# Patient Record
Sex: Female | Born: 1950 | Race: White | Hispanic: No | Marital: Married
Health system: Midwestern US, Academic
[De-identification: ages and names within clinical notes are randomized; demographics above are authoritative.]

---

## 2020-02-13 IMAGING — CT ABDOMEN_PELVIS W(Adult)
2 of 3 series · 13 of 46 positions shown, 15 images · IV contrast (Omnipaque)
Comparison: December 27, 2019

EXAM:  CT ABDOMEN AND PELVIS WITH INTRAVENOUS CONTRAST  (09100)
INDICATION: ever recent laparotomy Pt c/o fever after recent laparotomy today. H/o
cholecystectomy, hysterectomy, appy, and bowel resection. Creat:0.84 GFR:64.4 used. Prior CT
12/27/19. CT/NM 1/0. CF/AK
TECHNIQUE: Axial computed tomography images of the abdomen and pelvis with intravenous contrast.
Sagittal and coronal reformatted images were created and reviewed.

[Series 2: abdomen_pelvis ax 3.00 br40 s3 · axial · 0.57mm/px · z∈[+1317,+1686]mm · 10 of 143 slices shown, 12 images]
[im 10/143  soft-tissue]
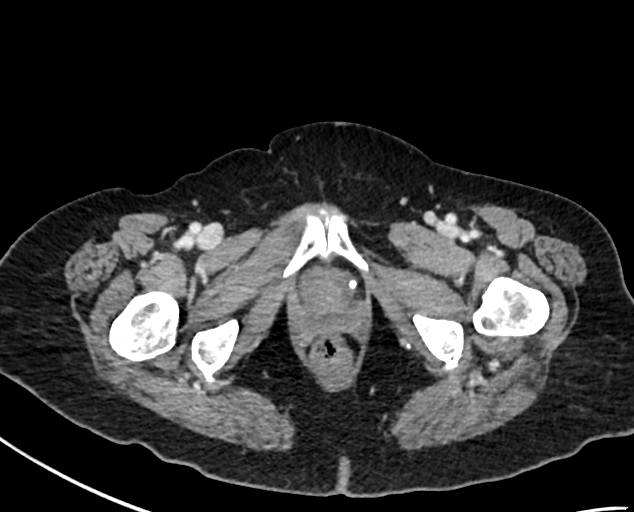
[im 10/143  bone]
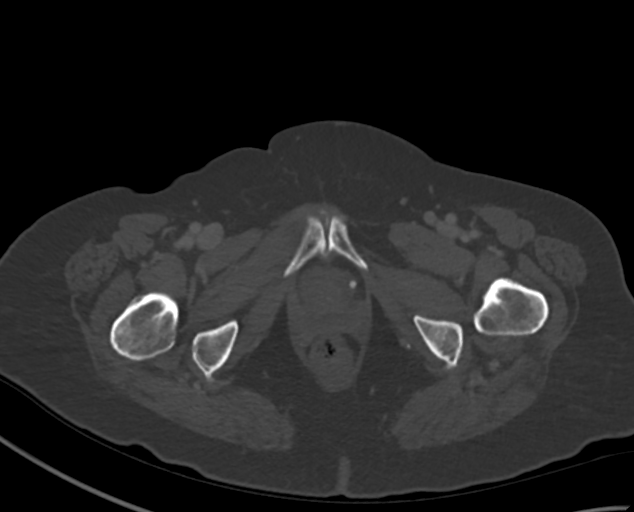
[im 23/143  soft-tissue]
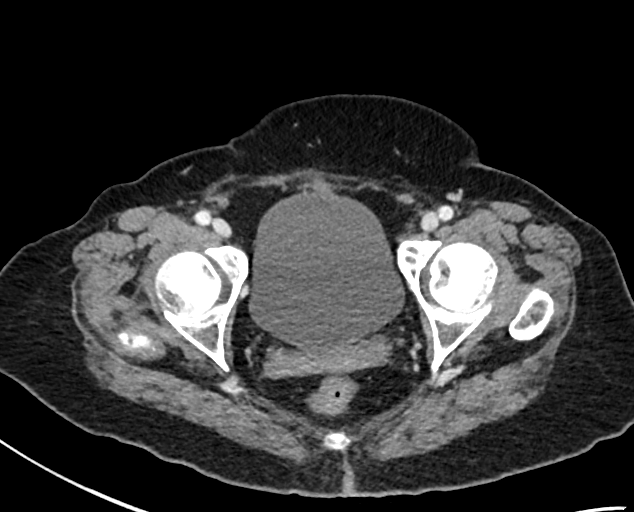
[im 37/143  soft-tissue]
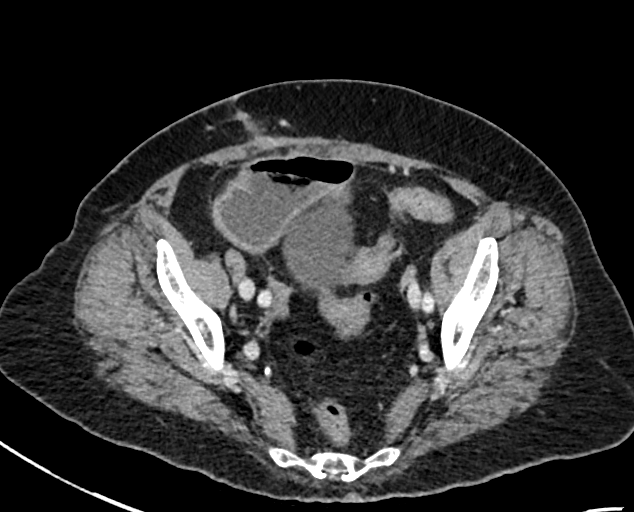
[im 51/143  soft-tissue]
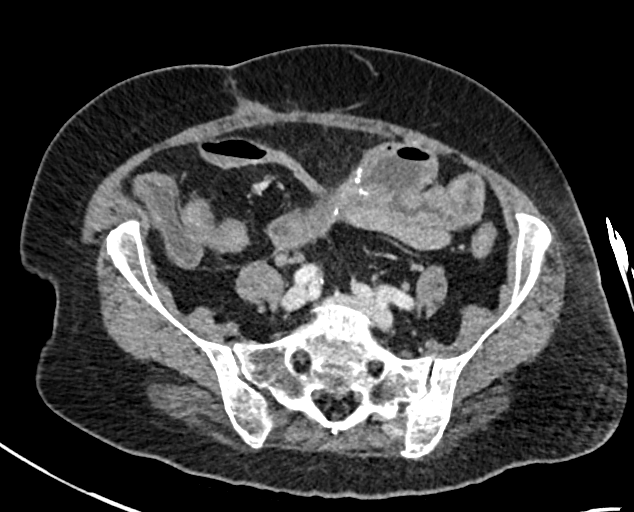
[im 65/143  soft-tissue]
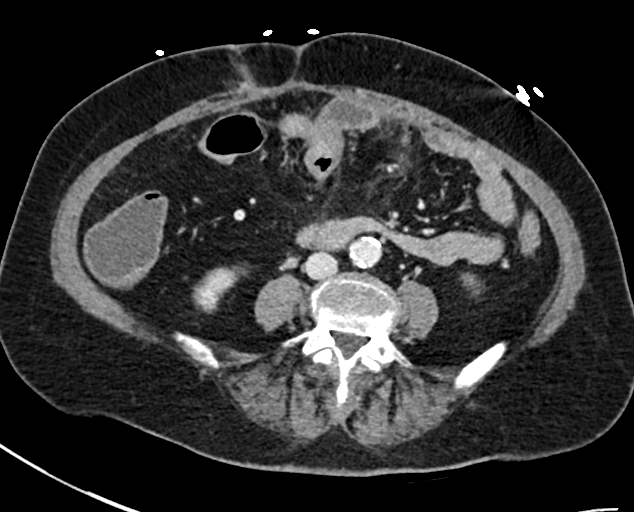
[im 78/143  soft-tissue]
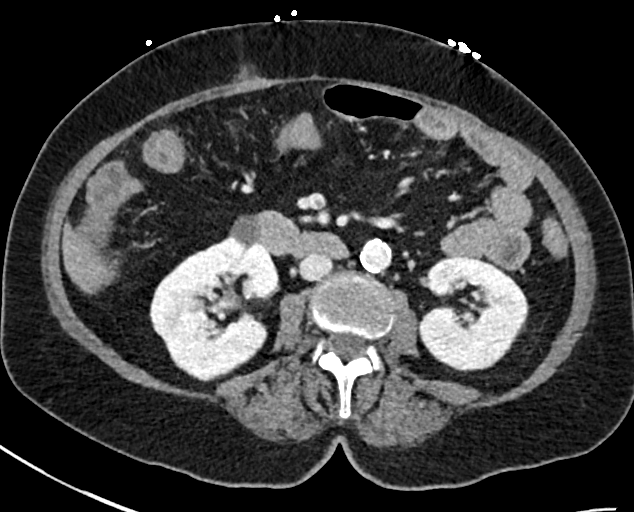
[im 92/143  soft-tissue]
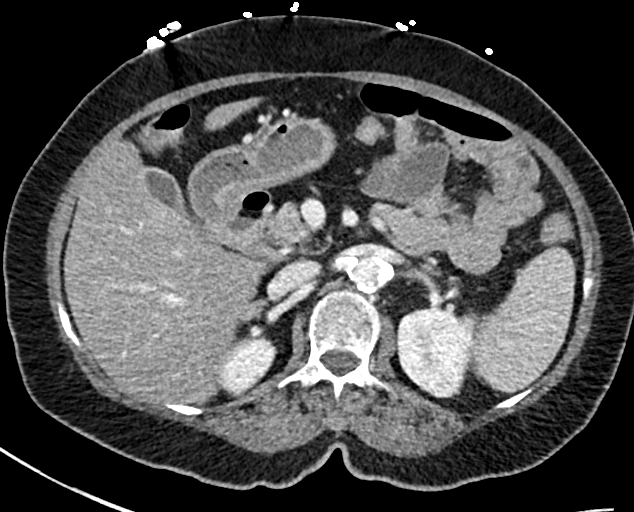
[im 106/143  soft-tissue]
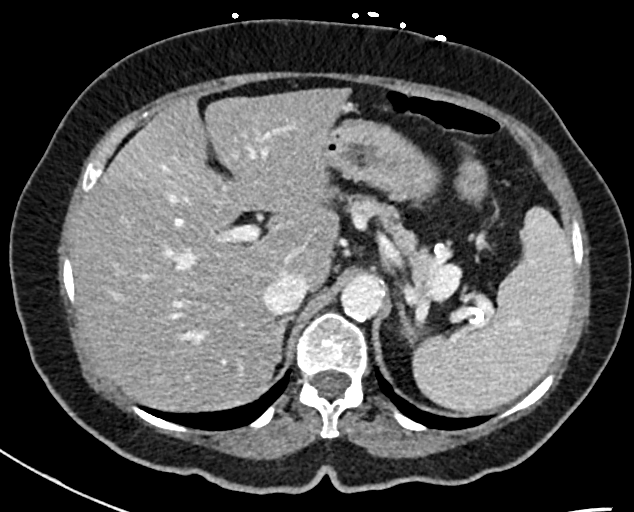
[im 120/143  soft-tissue]
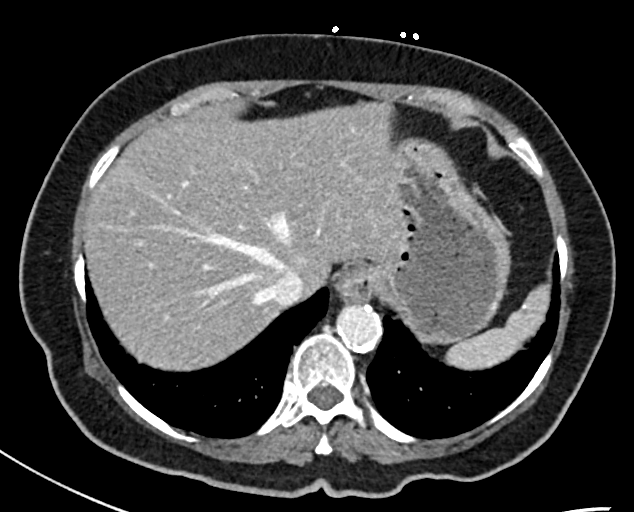
[im 120/143  bone]
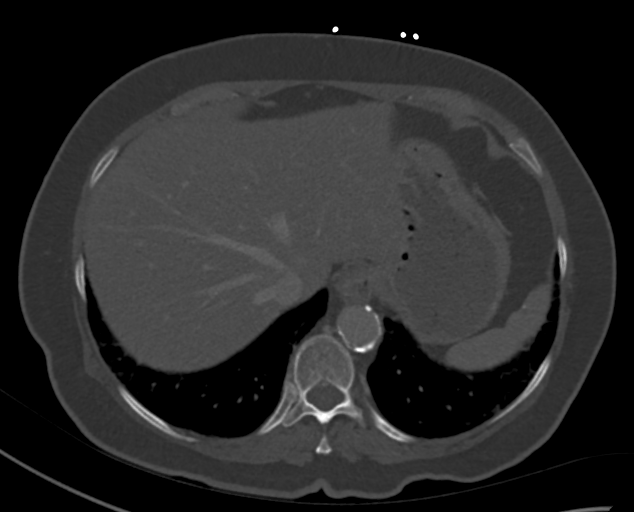
[im 133/143  soft-tissue]
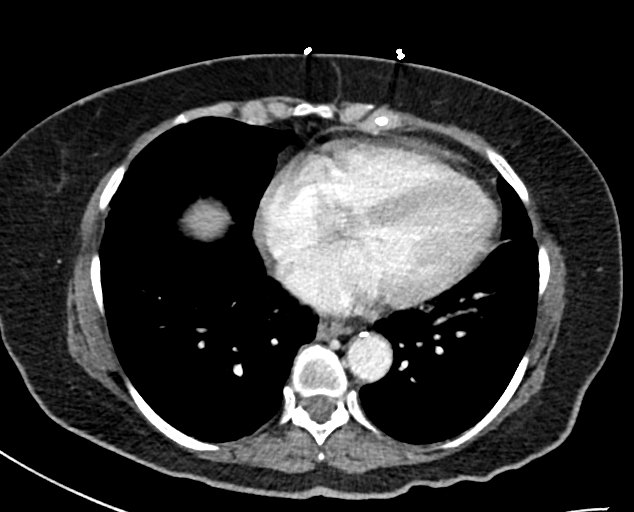

[Series 4: abdomen_pelvis cor 3.00 br40 s3 · coronal · 0.70mm/px · 3 of 94 slices shown]
[im 32/94  soft-tissue]
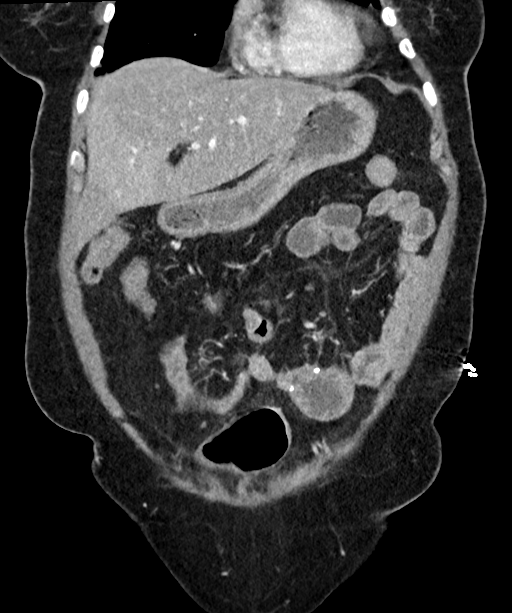
[im 42/94  soft-tissue]
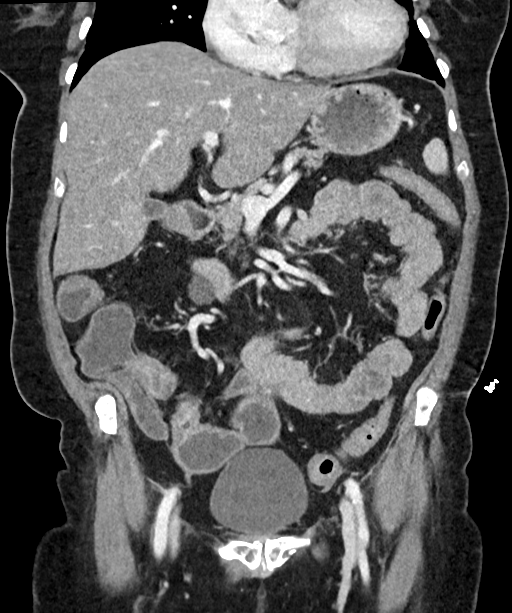
[im 52/94  soft-tissue]
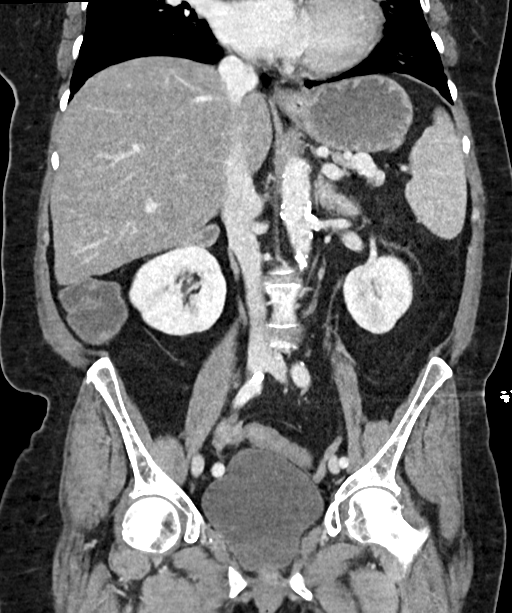

[13 of 46 positions shown; findings below may reference images not displayed]

All CT scans at this facility use dose modulation, interval reconstruction, and/or weight-based
dosing when appropriate to reduce radiation dose to as low as reasonably achievable.

Number of previous computed tomography exams in the last 12 months is 1.

Number of previous nuclear medicine myocardial perfusion studies in the last 12 months is 0.

CONTRAST:  [id number]
FINDINGS: Superior mesenteric venous thrombosis involving a LEFT branch, 67-72/2.  Mild dilation of the
portal vein.

Esophageal hiatal hernia.  Accentuation of the gastric wall with perigastric hyperemia.
Postoperative changes of the small bowel with ischemic enteritis morphology of small bowel segment
in the LEFT lower quadrant.  Adjacent mesenteric edema.  Segmental areas of bowel wall thickening
mainly involving the small bowel with soft tissue attenuation nonspecific.  Overall less dilation of
small bowel when compared to previous.  No definite mechanical small bowel obstruction.  Nodular
enhancing focus noted within the terminal ileal lumen on image 45/4, for example.  Neoplastic small
bowel lesion not excluded.  Diverticulosis without diverticulitis. Accentuation of the colonic wall
in part related to nondistention. No pathologic free fluid.  No free air. Hysterectomy.

Nonspecific subpleural reticular nodular opacities seen with bronchiolitis, for example.
Atelectatic changes.

Trace pericardial effusion.

No acute pathology of the included liver, biliary system, spleen, pancreas, adrenal glands, or
kidneys and unchanged.  Tortuous ectasia versus aneurysmal dilation of the peripheral splenic artery
at the splenic hilum, unchanged.  Postop changes in the RIGHT ventral subcutaneous superficial soft
tissues with focus of gas in the wall.  No abscess.

Osseous degenerative changes unchanged.  Osseous degenerative changes and osteopenia.
IMPRESSION: - Superior mesenteric venous thrombosis involving a LEFT branch and early ischemic enteritis
morphology as described.

- There are other chronic/incidental findings as described.

Footer: FINDING CONSIDERED CRITICAL TO PATIENT CARE.  Case discussed telephonically with Tiger

Tech Notes:

Pt c/o fever after recent laparotomy today. H/o cholecystectomy, hysterectomy, appy, and bowel
resection. Creat:0.84 GFR:64.4 [id number] used. Prior CT 12/27/19. CT/NM 1/0. CF/AK

## 2020-02-13 IMAGING — CR CHEST
1 series · 1 of 1 positions shown · non-contrast
Comparison: none

[chest port x-wise]
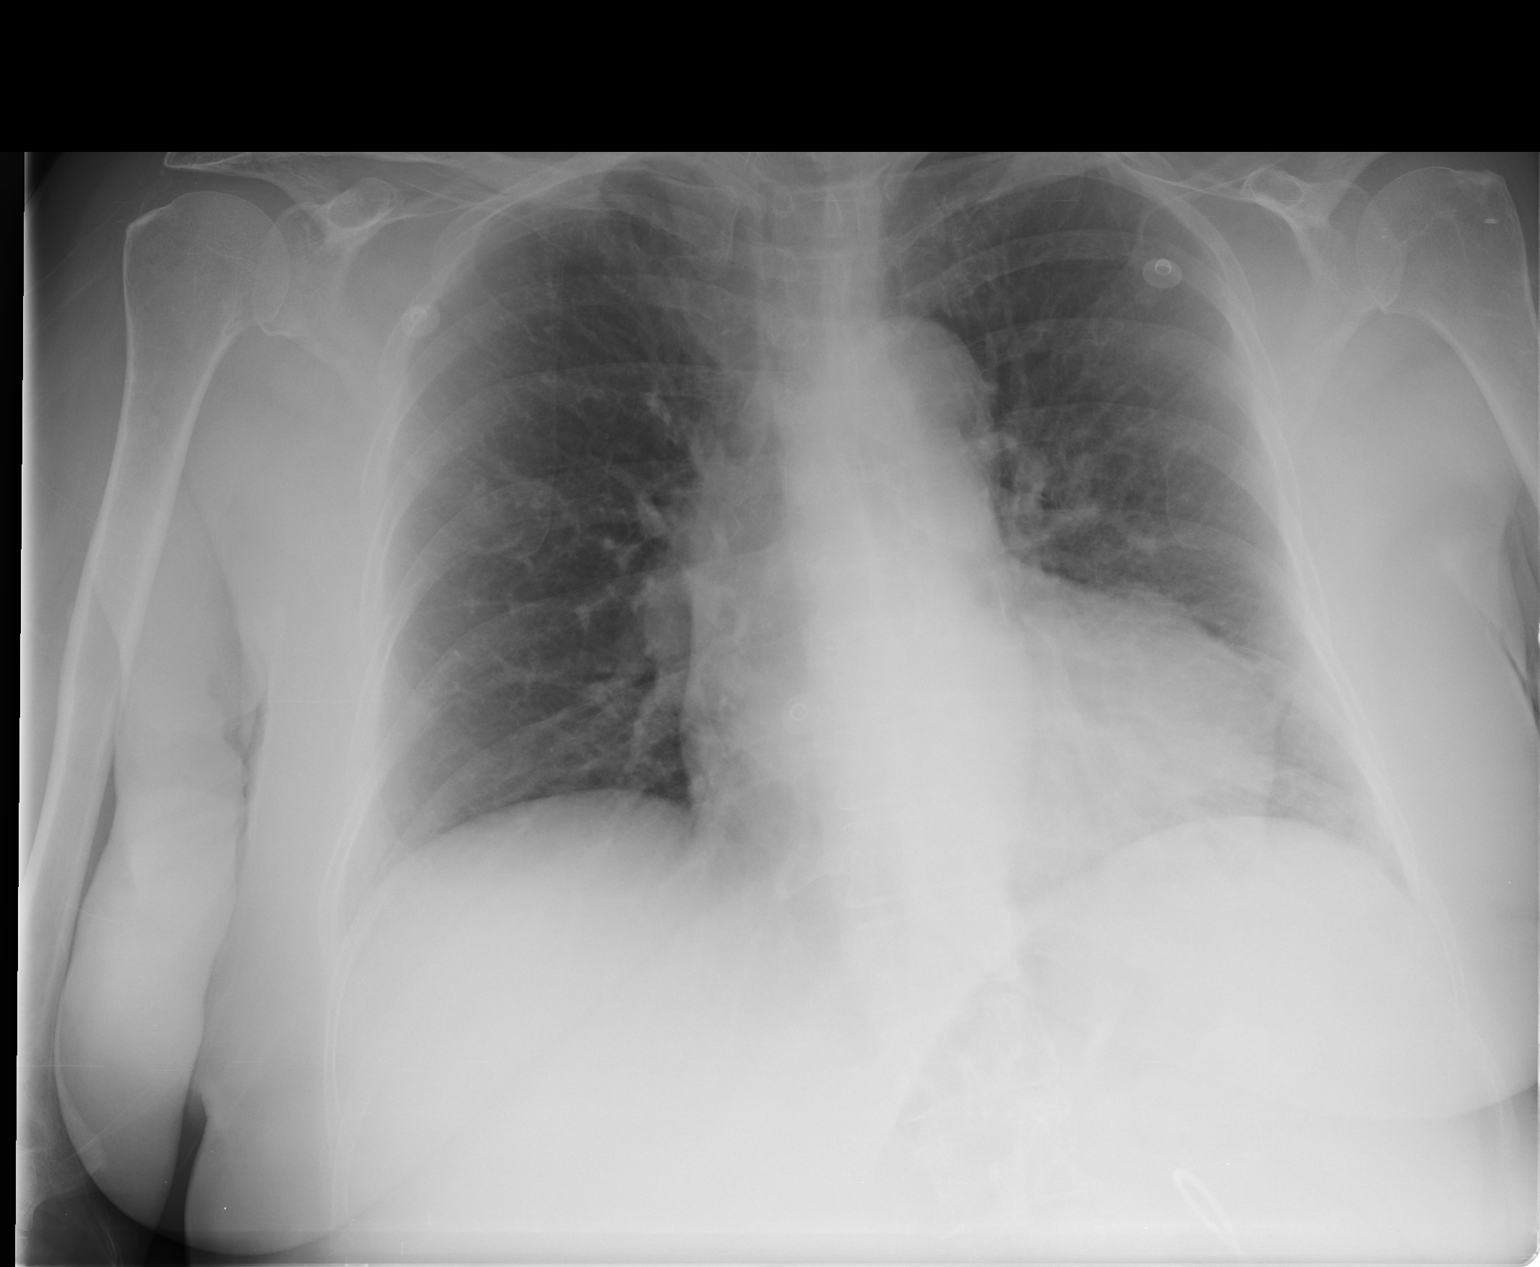

[1 of 1 positions shown; findings below may reference images not displayed]

DIAGNOSTIC STUDIES

EXAM

XR chest 1V

INDICATION

fever
pt states she hasnt been feeling well since her colonoscopy this morning. c/o weakness, fever. prev
12/28/19. Ak

TECHNIQUE

AP chest.

COMPARISONS

December 28, 2019

FINDINGS

Cardiac silhouette is unchanged. No acute infiltrates are seen. Bony thorax is stable.

IMPRESSION

No evidence for active disease in the chest.

Tech Notes:

pt states she hasnt been feeling well since her colonoscopy this morning. c/o weakness, fever. prev
12/28/19. Ak

## 2020-03-30 IMAGING — MG MAMMOGRAM 3D SCREEN, BILATERAL
7 series · 8 of 8 positions shown · non-contrast
Comparison: none

[R (1 of 2)]
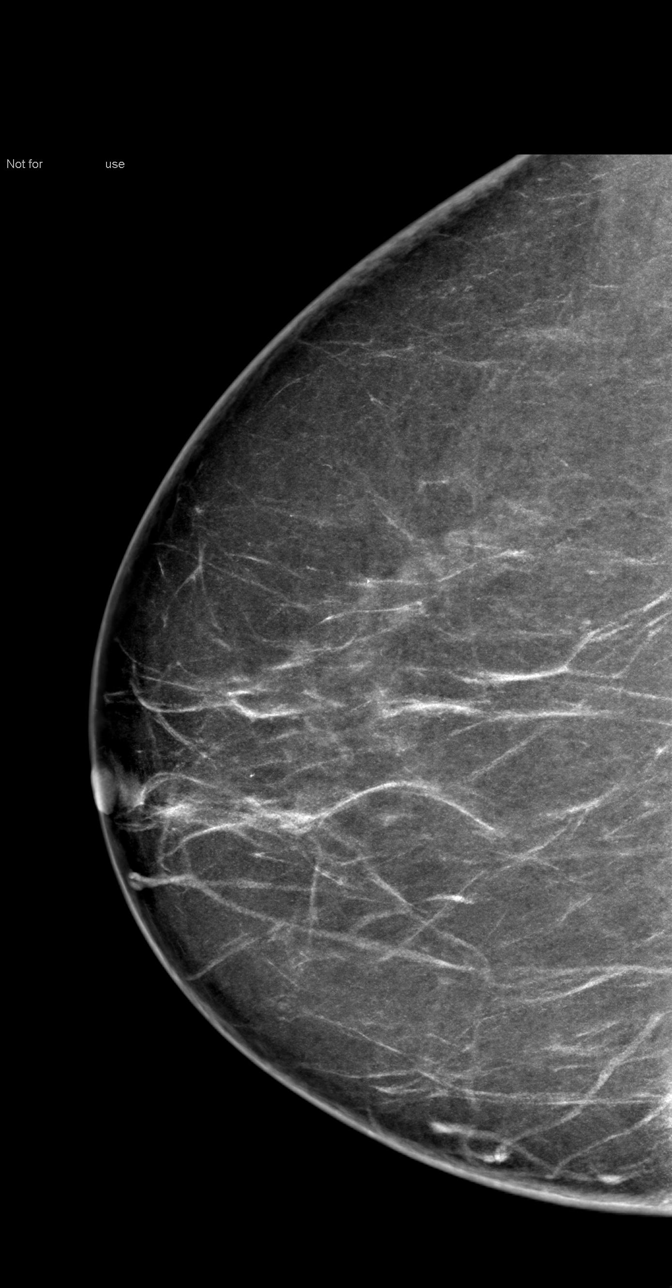

[L tomo (1 of 2)]
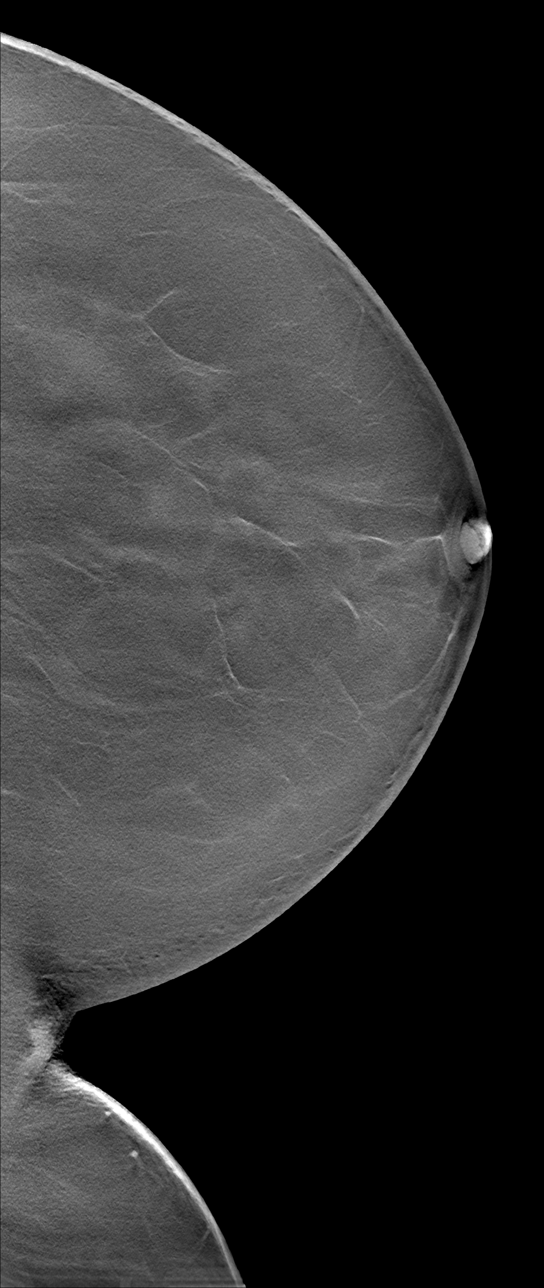

[L (1 of 2)]
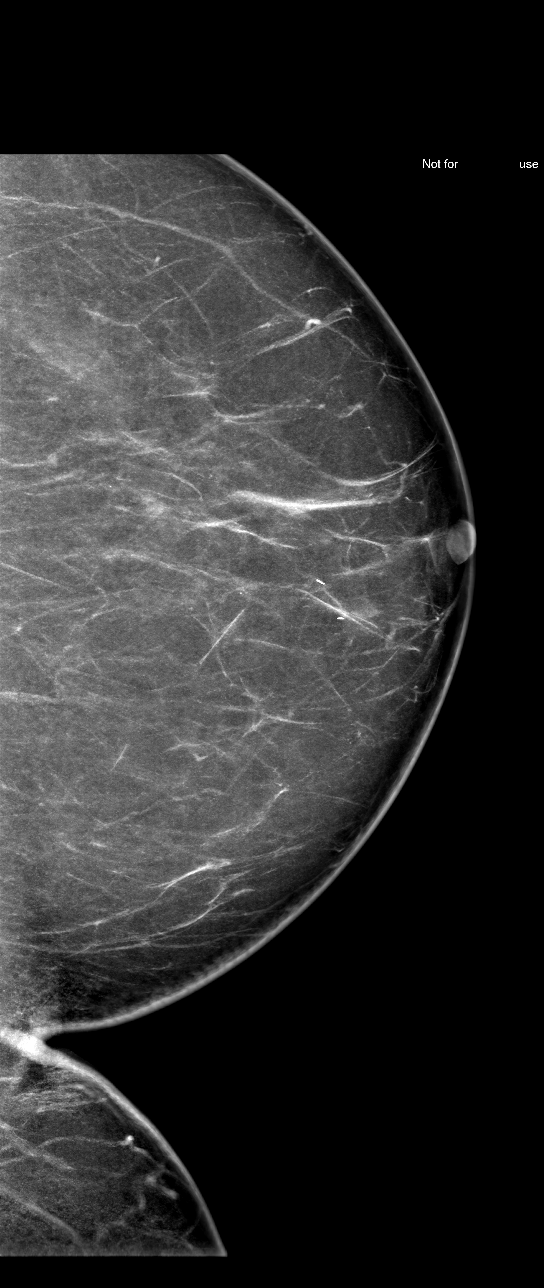

[R tomo]
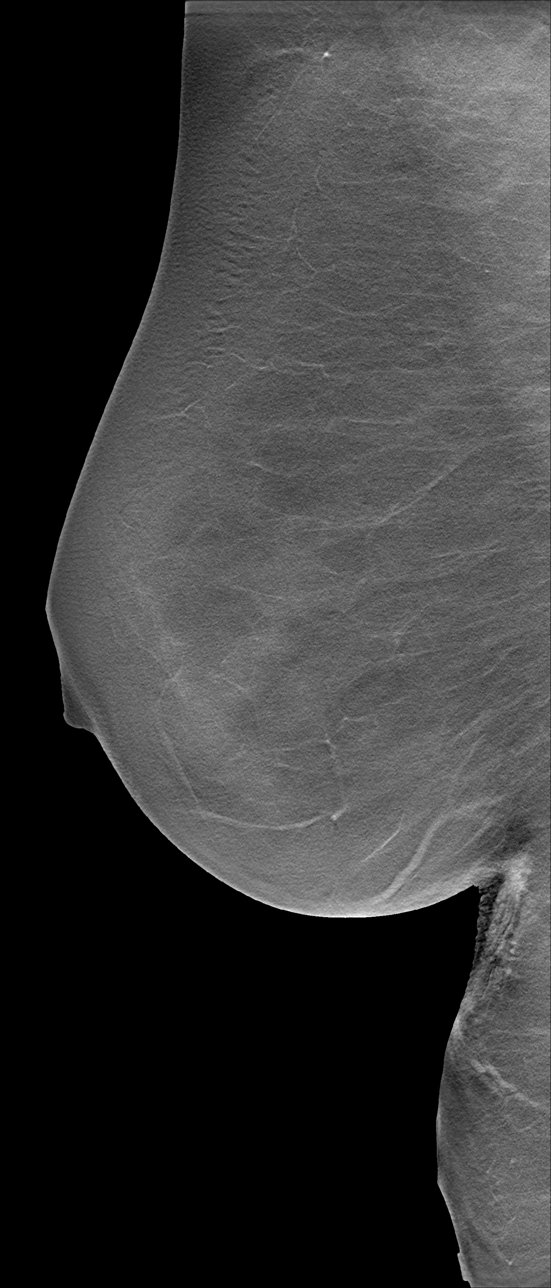

[R (2 of 2)]
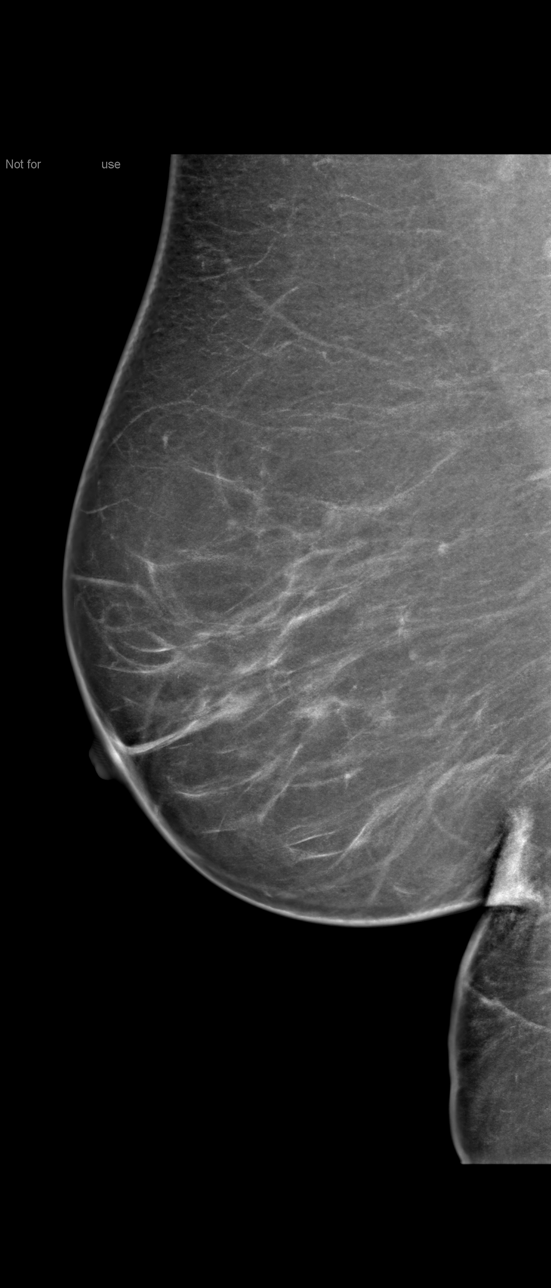

[Series 19: L tomo · sagittal · left · 1.0mm · 0.09mm/px · 2 of 13 slices shown (2 of 2)]
[im 1/13]
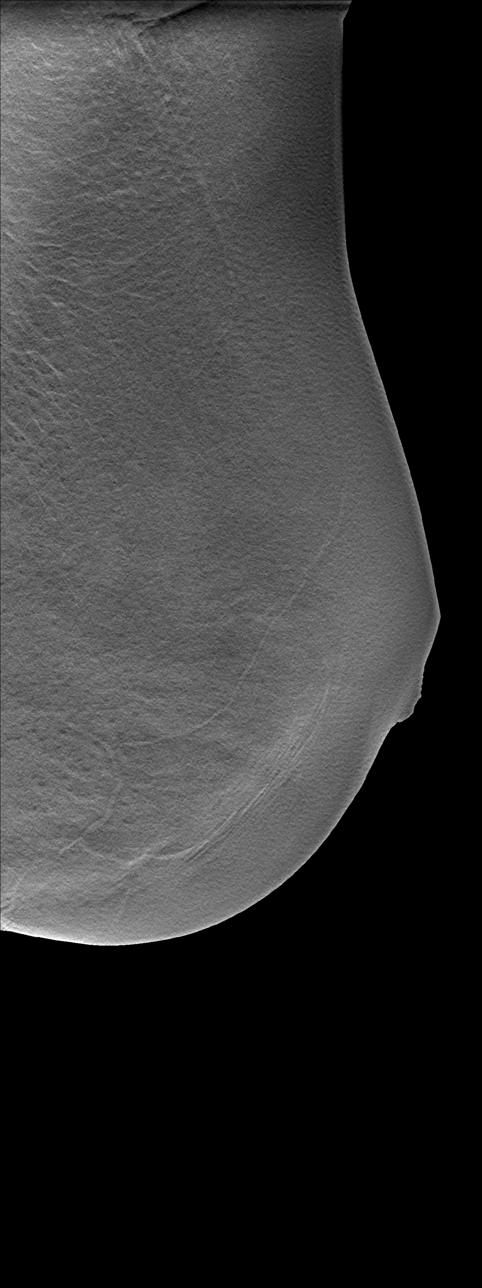
[im 13/13]
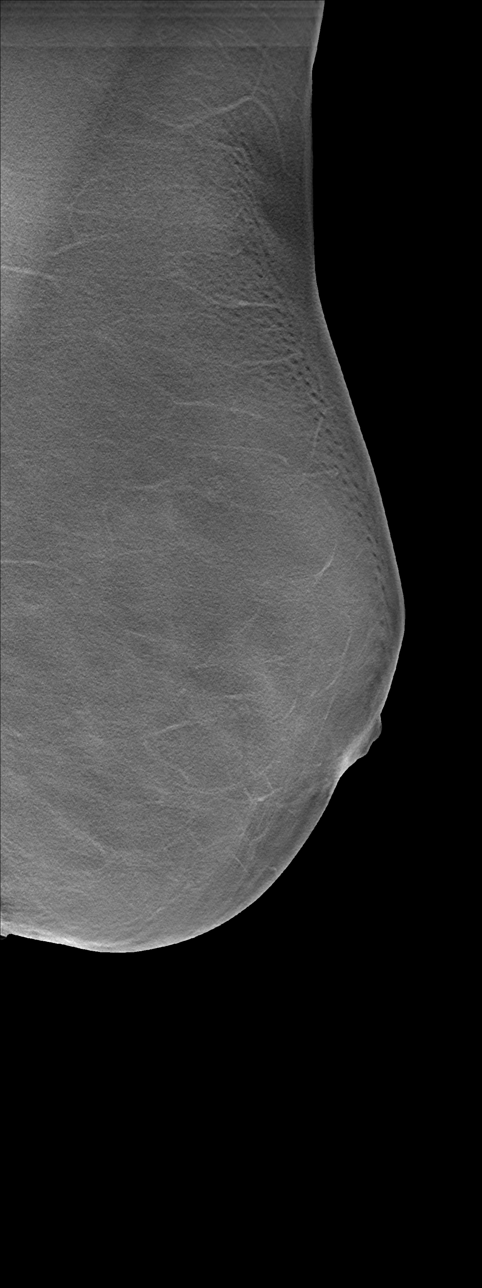

[L (2 of 2)]
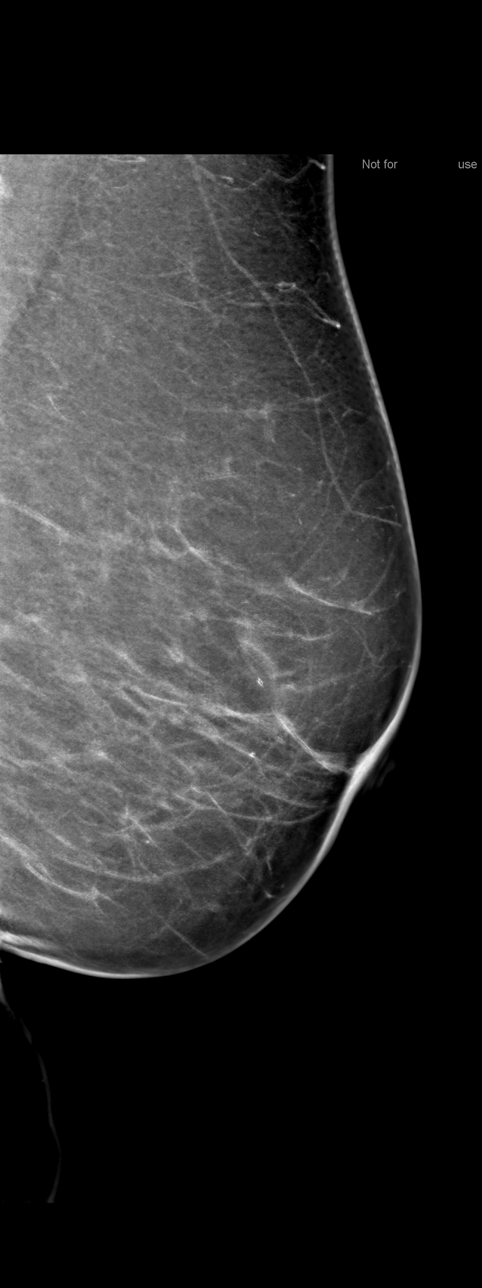

[8 of 8 positions shown; findings below may reference images not displayed]

EXAM

3D SCREENING MAMMOGRAM, BILATERAL

INDICATION

screening
SKIN CA @ 2654.  SCREENING.  AB (3D) PRIORS: 2654.

TECHNIQUE

Digital 2D CC and MLO projections obtained with 3D tomographic views per manufacturer's protocol.
ICAD version 7.2 was used during this exam.

COMPARISONS

September 19, 2017

FINDINGS

ACR Type 1:  <25% Breast is almost entirely fat.

No concerning masses, calcifications, or interval changes are seen.

IMPRESSION

Stable mammograms. One year follow-up is recommended.

BI-RADS 2, BENIGN.

Tech Notes:

## 2020-04-14 IMAGING — CR CHEST
1 series · 1 of 1 positions shown · non-contrast
Comparison: none

[chest port x-wise]
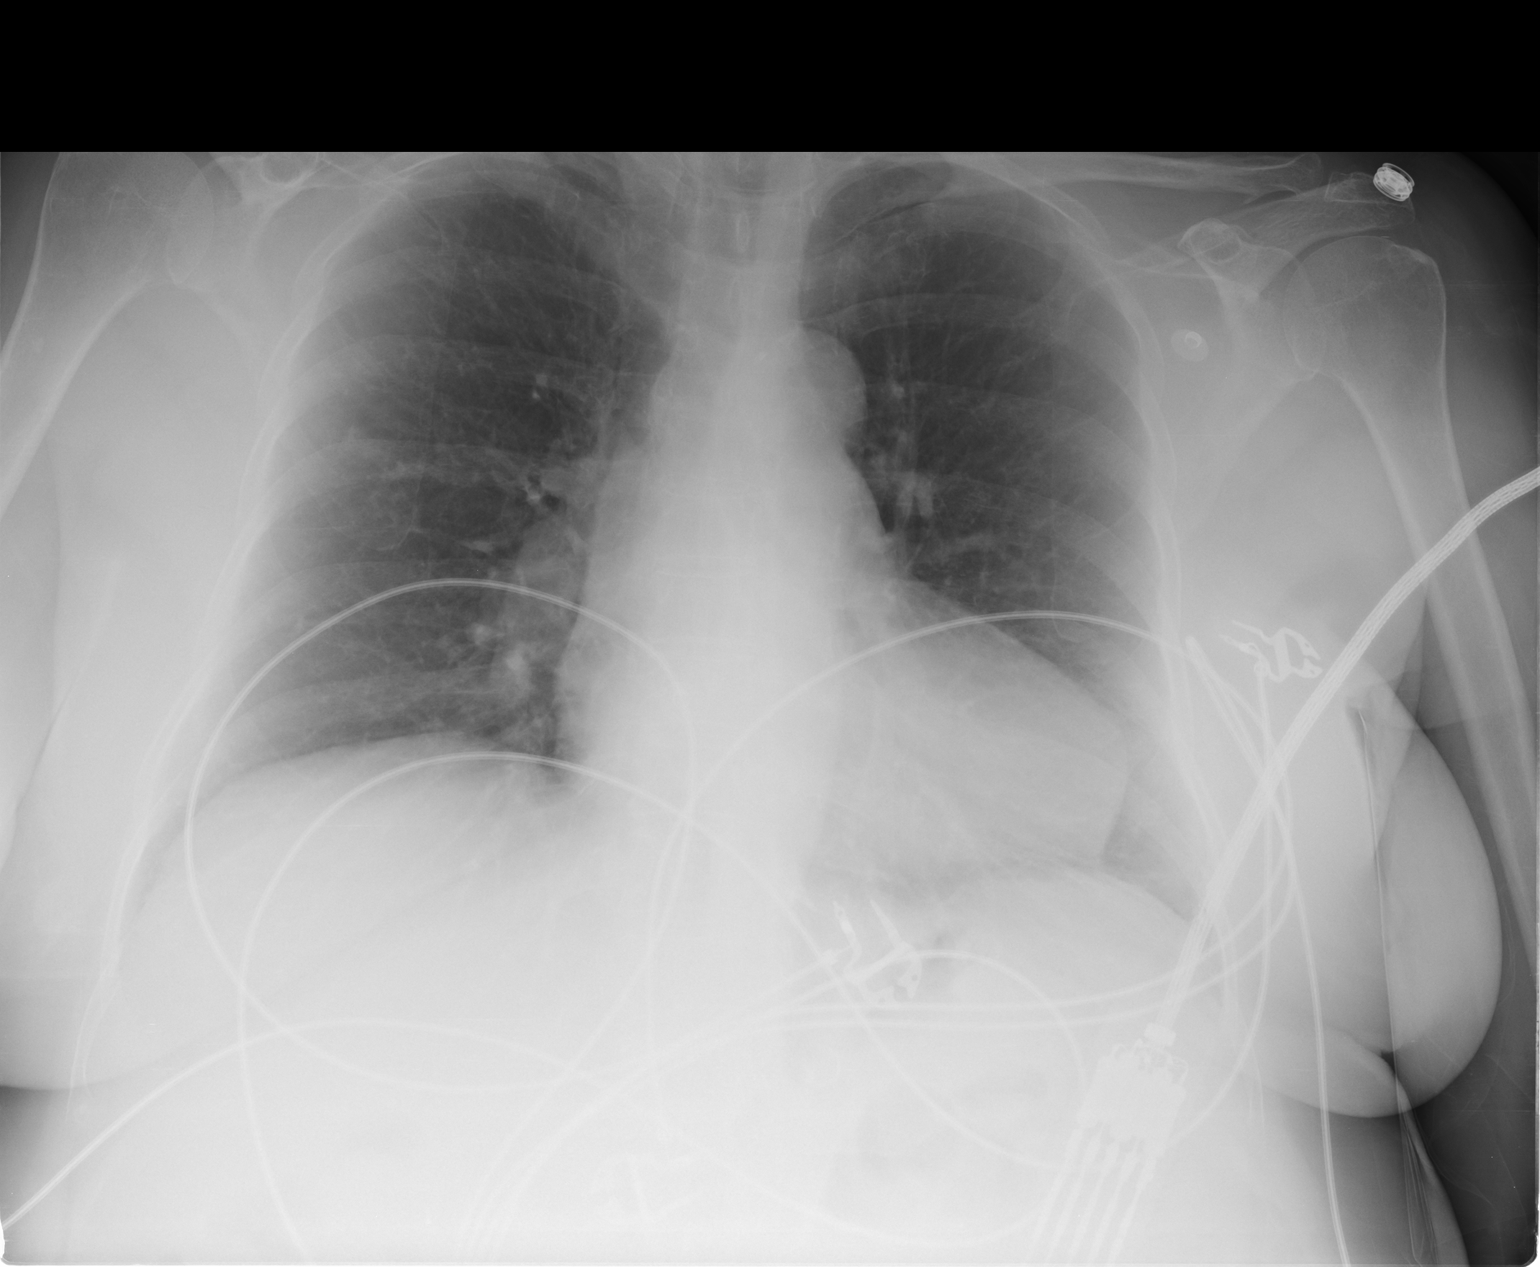

[1 of 1 positions shown; findings below may reference images not displayed]

DIAGNOSTIC STUDIES

EXAM

XR chest 1V

INDICATION

tachycardia
pt c/o tachycardia, recent cold with cough. denies sx hx to chest. prev 02/13/20 -AK

TECHNIQUE

Portable AP chest.

COMPARISONS

February 13, 2020

FINDINGS

Mild cardiomegaly is noted. No acute infiltrates are seen. Osseous structures are unchanged.

IMPRESSION

No evidence for active disease in the chest.

Tech Notes:

pt c/o tachycardia, recent cold with cough. denies sx hx to chest. prev 02/13/20 -AK

## 2021-04-09 IMAGING — MR Head^Brain
5 series · 14 of 14 positions shown · non-contrast
Comparison: none

[Series 2: T1 · sagittal · 5.0mm · 0.45mm/px · 3 of 3 slices shown (1 of 2)]
[im 1/3]
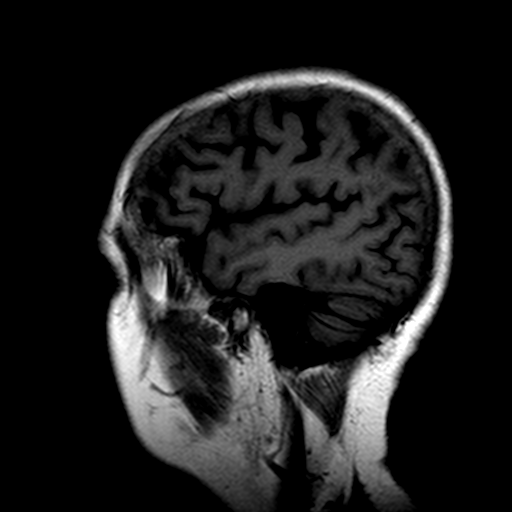
[im 2/3]
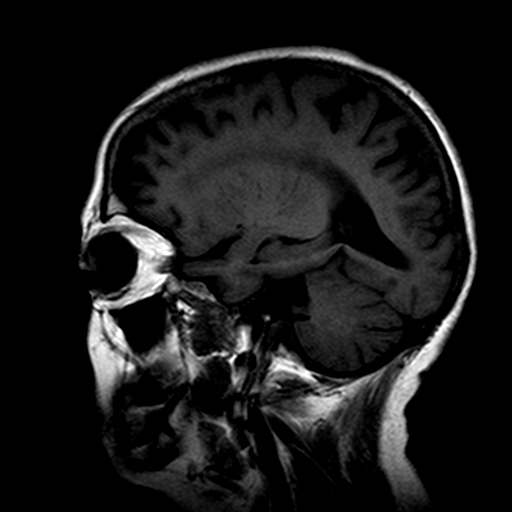
[im 3/3]
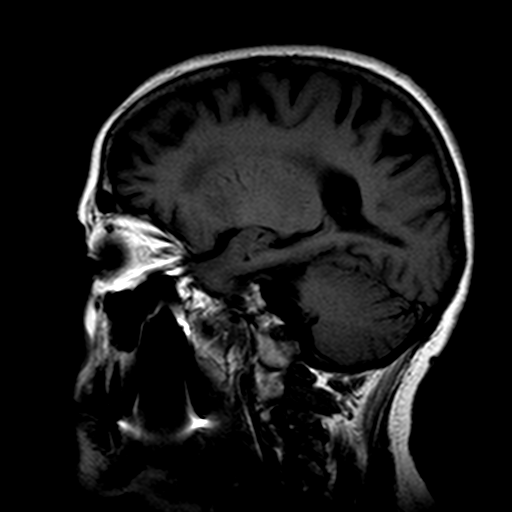

[Series 5: FLAIR · axial · 5.0mm · 0.45mm/px · z∈[-50,+83]mm · 3 of 3 slices shown]
[im 1/3]
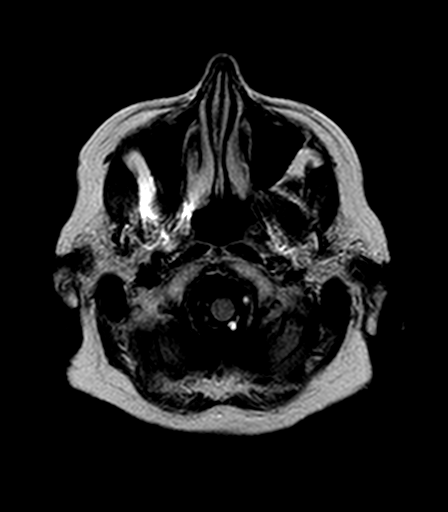
[im 2/3]
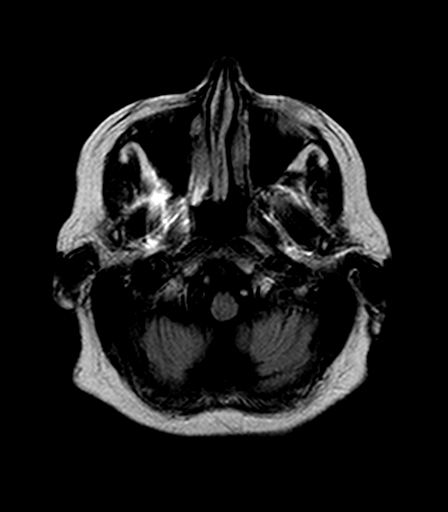
[im 3/3]
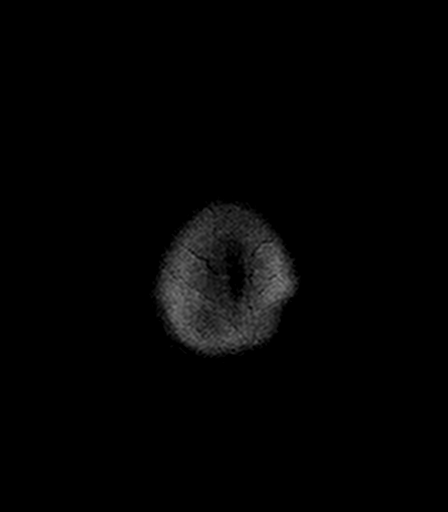

[Series 6: T2 · axial · 5.0mm · 0.72mm/px · 1 of 1 slices shown (1 of 2)]
[im 1/1]
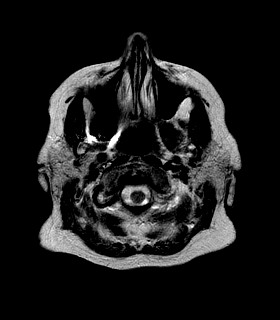

[Series 7: T1 · axial · 5.0mm · 0.45mm/px · 1 of 1 slices shown (2 of 2)]
[im 1/1]
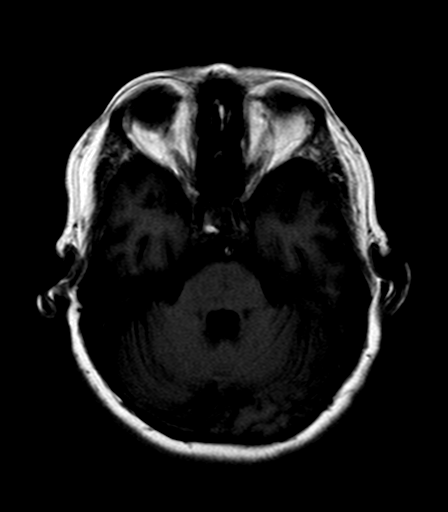

[Series 9: T2 · coronal · 5.0mm · 0.69mm/px · 6 of 6 slices shown (2 of 2)]
[im 1/6]
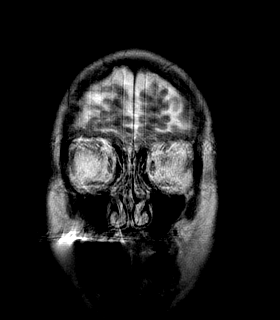
[im 2/6]
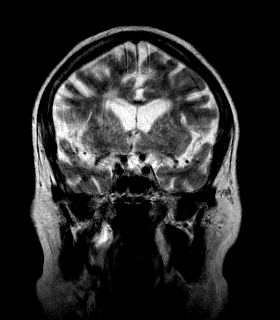
[im 3/6]
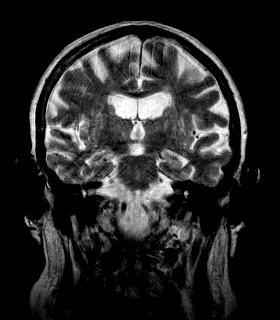
[im 4/6]
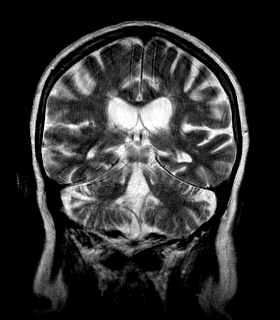
[im 5/6]
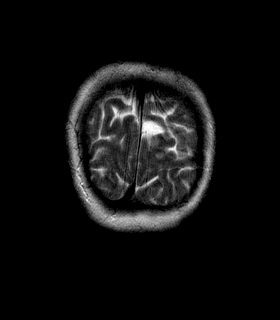
[im 6/6]
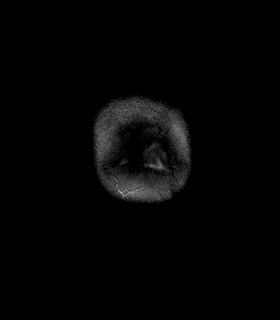

[14 of 14 positions shown; findings below may reference images not displayed]

DIAGNOSTIC STUDIES

EXAM

Brain MRI.

INDICATION

memory loss,depression,anxiety,stress at home
MEMORY LOSS, DEPRESSION, CONFUSION, ANXIETY.  NEW ONSET.  RG

TECHNIQUE

Routine noncontrast brain MRI protocol.

COMPARISONS

August 15, 2018

FINDINGS

There is no diffusion restriction to suggest acute or subacute ischemia. The lateral ventricles are
prominent in size with suggestion of volume loss but there is no evidence of sulcal effacement to
suggest hydrocephalus. There is moderate bright T2/FLAIR signal intensity throughout the centrum
semiovale, most pronounced around the lateral ventricles. Findings are nonspecific but are
suggestive of chronic small vessel ischemic change. There is no midline shift. No mass effect. No
intracranial hemorrhage. The paranasal sinuses and mastoid air cells are unremarkable. There is
abnormal signal in the left vertebral artery, the right vertebral artery is prominent in size and
basilar artery shows normal signal characteristics. The anterior circulation is unremarkable.

IMPRESSION

Abnormal signal in the left vertebral artery, possibly reflecting stenosis/occlusion. However,
there is a dominant right vertebral artery with normal appearance of the basilar artery. CT or MR
angiography could be performed for more definitive characterization. Probable small vessel ischemic
change and volume loss. No other acute intracranial pathology.

Tech Notes:

MEMORY LOSS, DEPRESSION, CONFUSION, ANXIETY.  NEW ONSET.  RG

## 2021-07-25 IMAGING — CR [ID]
3 series · 3 of 3 positions shown · non-contrast
Comparison: No relevant prior studies available.

DIAGNOSTIC STUDIES

EXAM:  XR LEFT HAND COMPLETE, 3 OR MORE VIEWS  ([id number])
INDICATION: fall Fell while walking her dog and has complaints of pain in her left hand.TB
TECHNIQUE: 3 or more views of the left hand.

[x hand pa left]
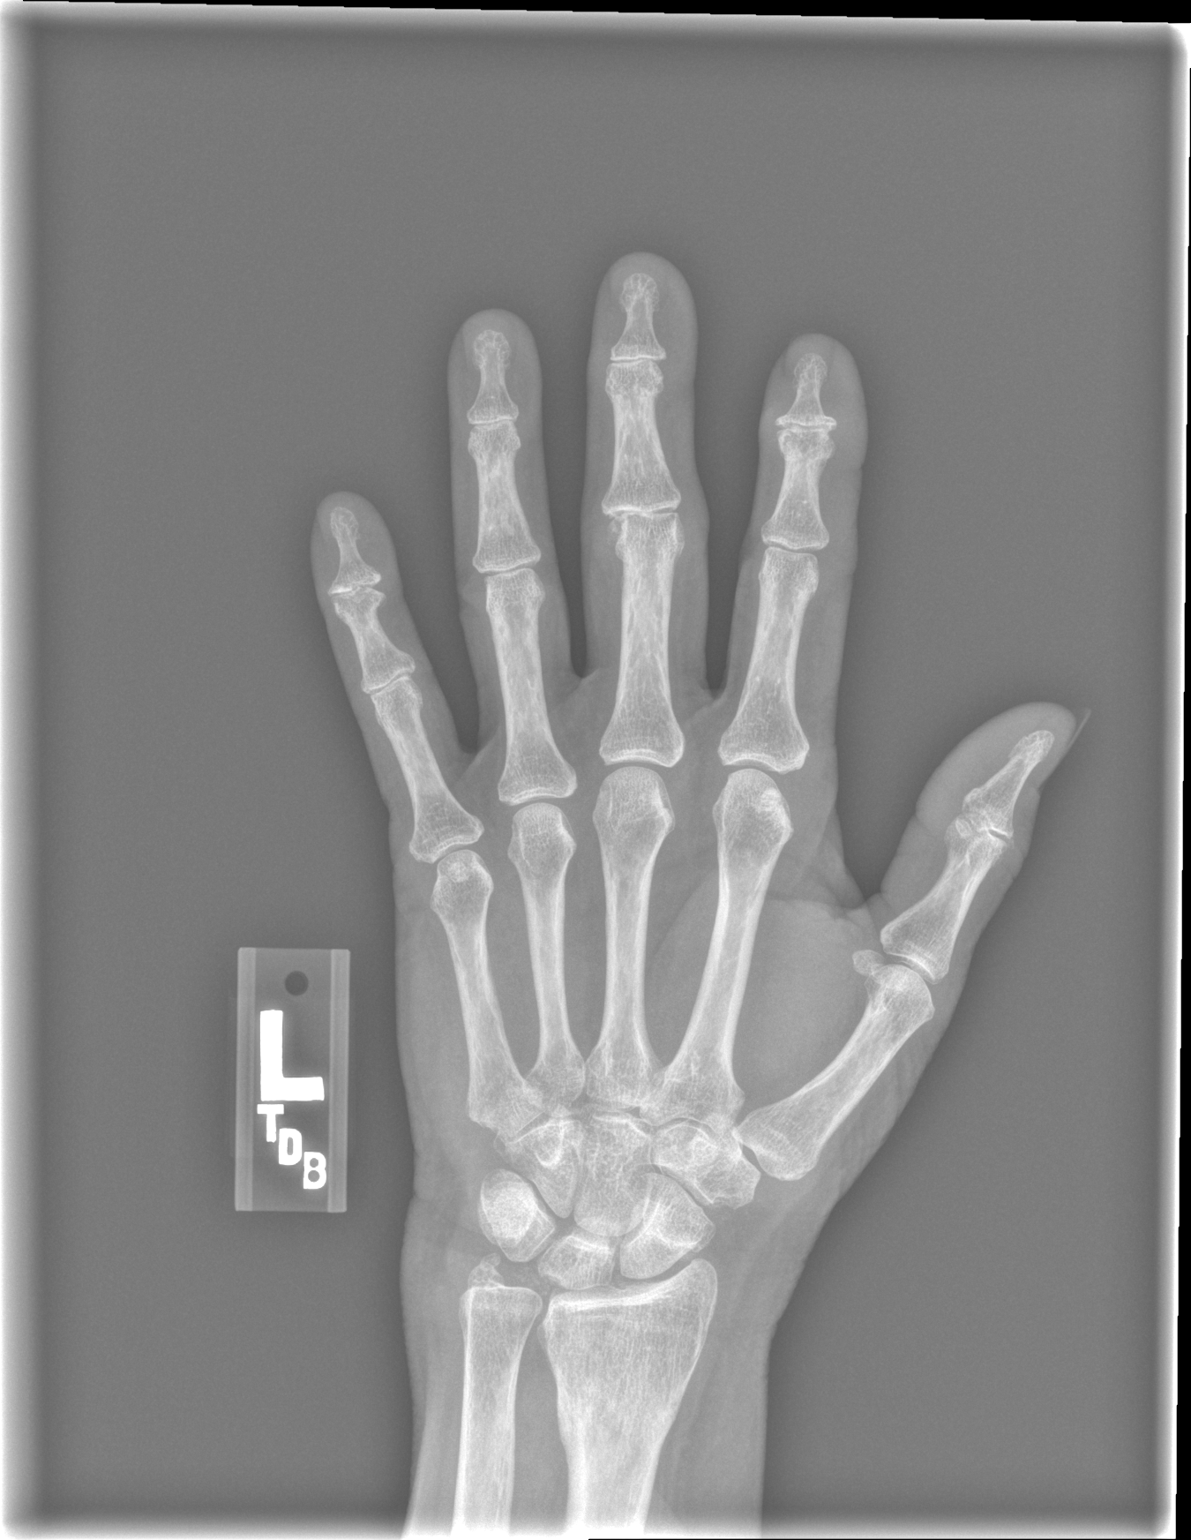

[x hand obl left]
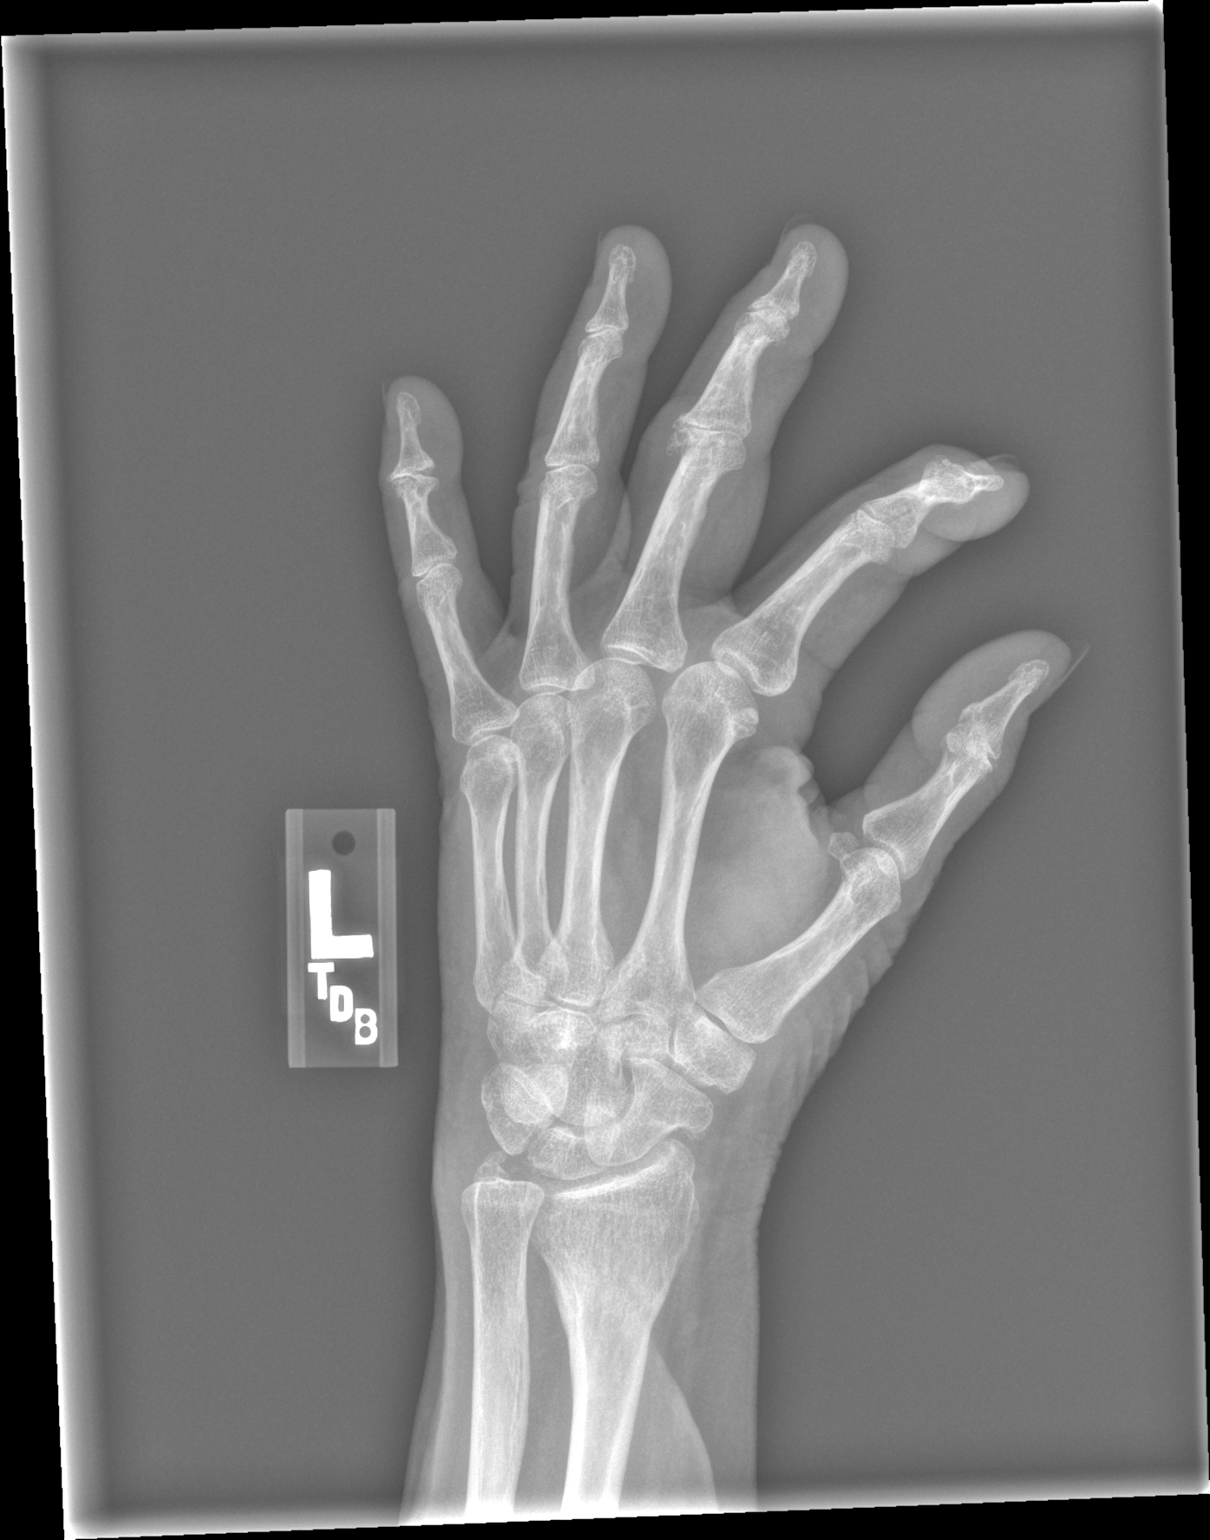

[x hand lat left]
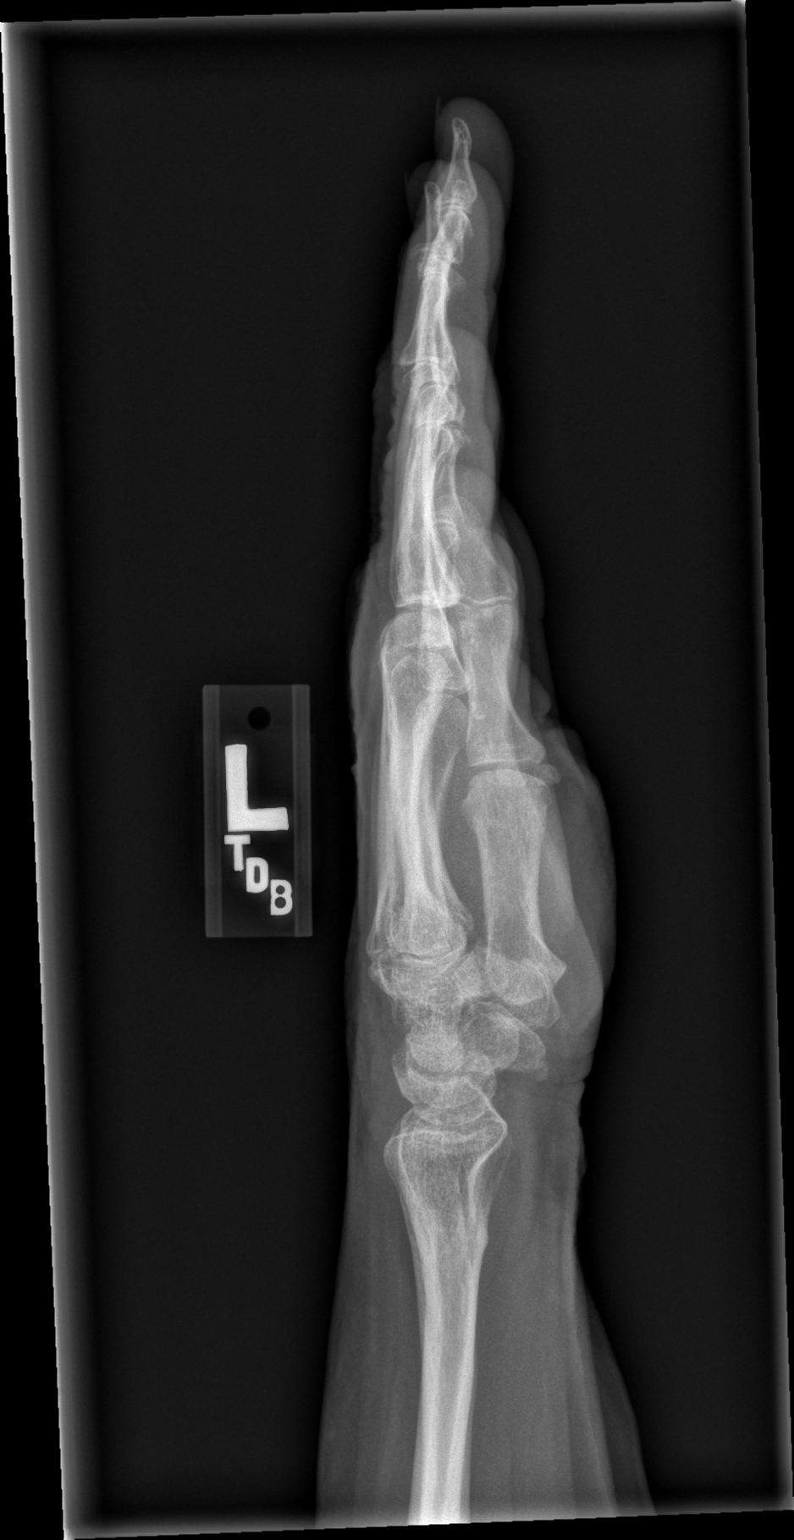

[3 of 3 positions shown; findings below may reference images not displayed]

FINDINGS: BONES/JOINTS:  Multilevel degenerative changes in the interphalangeal joints.

Mild wrist calcification is present consistent with chondrocalcinosis.

SOFT TISSUES:  NO radiopaque foreign body.
IMPRESSION: - NO acute fractures.

-Degenerative changes as described.

Tech Notes:

Fell while walking her dog and has complaints of pain in her left hand.TB

## 2021-07-25 IMAGING — CT HEADWO
3 of 4 series · 14 of 47 positions shown, 16 images · non-contrast
Comparison: No relevant prior studies available.
COMPARISON: No relevant prior studies available.

DIAGNOSTIC STUDIES

EXAM:  CT HEAD WITHOUT INTRAVENOUS CONTRAST  ([id number])
INDICATION: fall Fell while walking her dog and hit her head right above her left eye. CT/NM 0/0.
TB
TECHNIQUE: Axial computed tomography images of the head/brain without intravenous contrast.
Sagittal and coronal reformatted images were created and reviewed.
TECHNIQUE: Axial computed tomography images of the cervical spine without intravenous contrast.

[Series 4: brain cor 5.00 hr40 s3 · coronal · 0.33mm/px · 3 of 40 slices shown]
[im 14/40  brain]
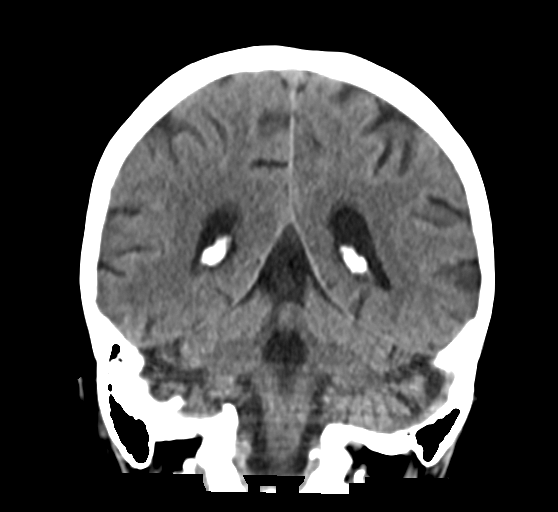
[im 18/40  brain]
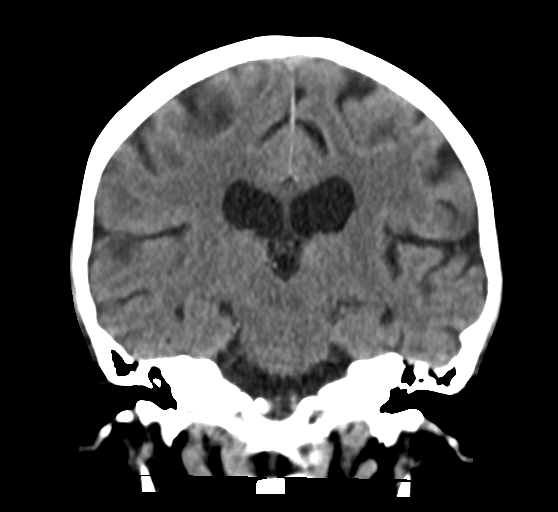
[im 22/40  brain]
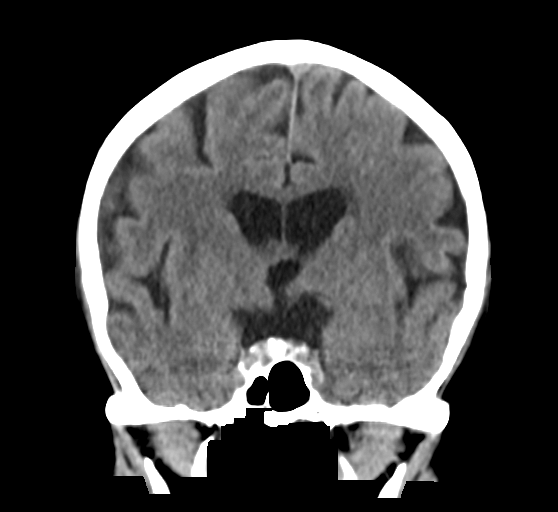

[Series 6: brain sag 5.00 hr40 s3 · sagittal · 0.33mm/px · 3 of 37 slices shown]
[im 13/37  brain]
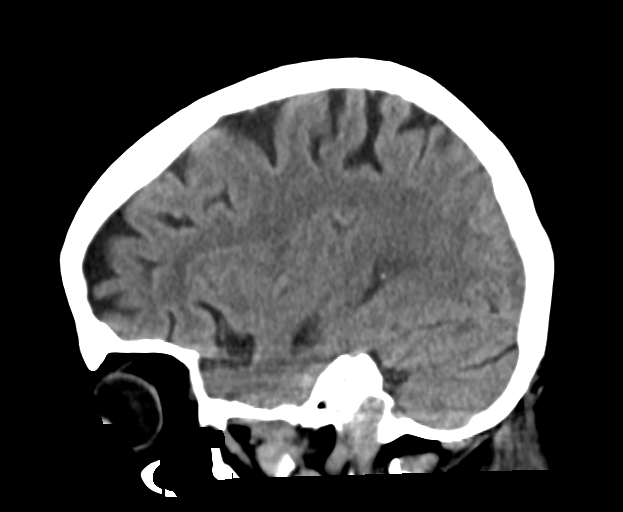
[im 19/37  brain]
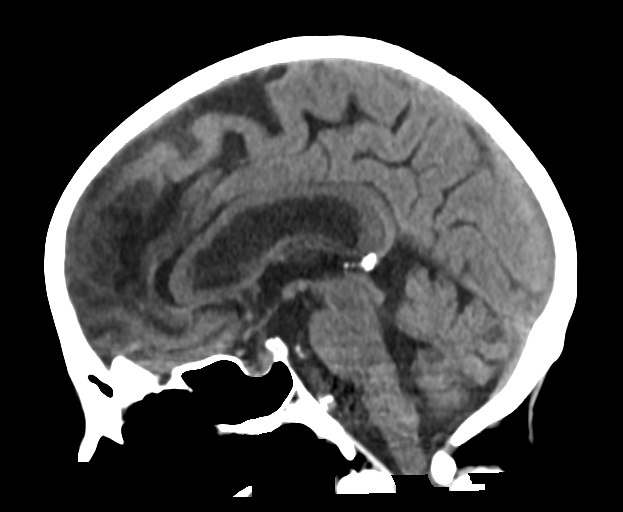
[im 25/37  brain]
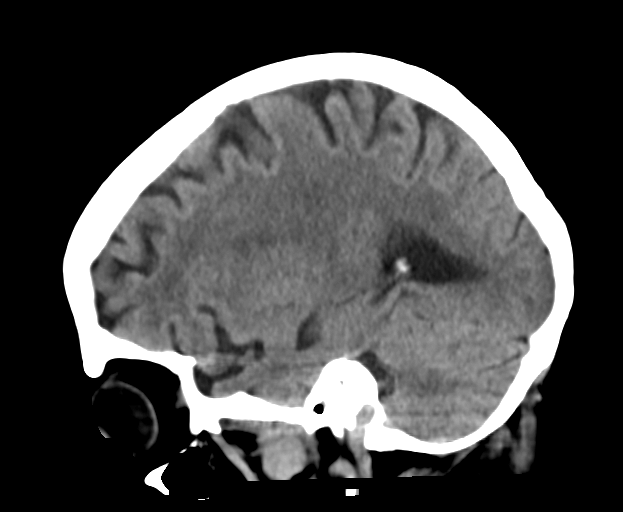

[Series 8: brain ax 2.00 hr60 s3 · axial · 0.36mm/px · z∈[-516,-383]mm · 8 of 84 slices shown, 10 images]
[im 9/84  brain]
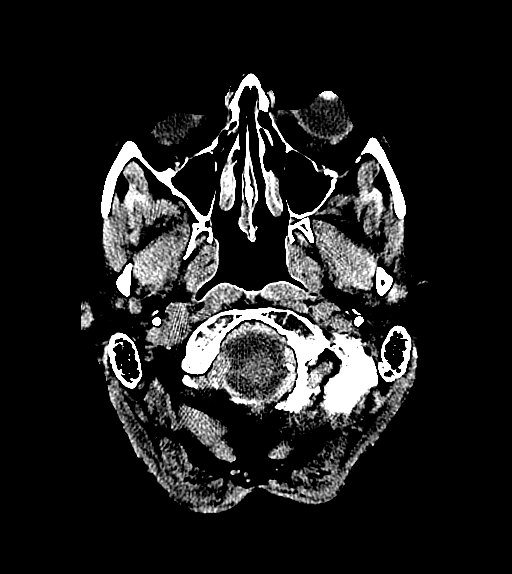
[im 9/84  bone]
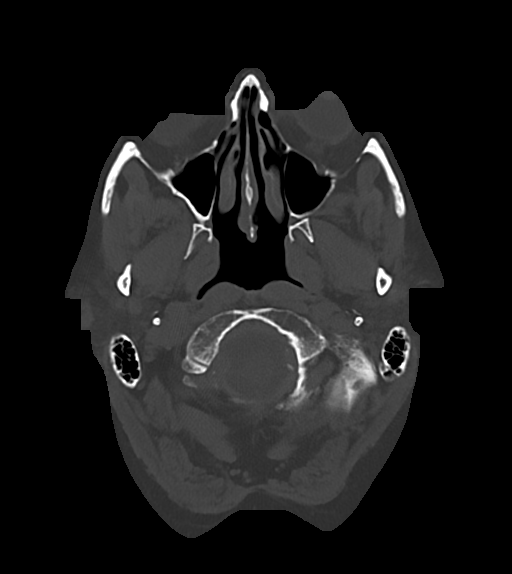
[im 17/84  brain]
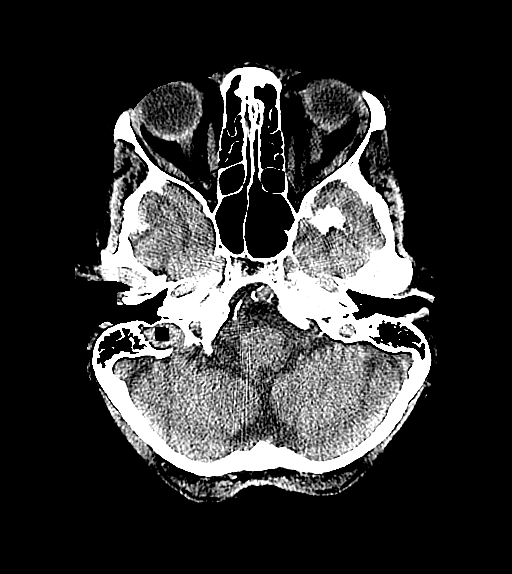
[im 25/84  brain]
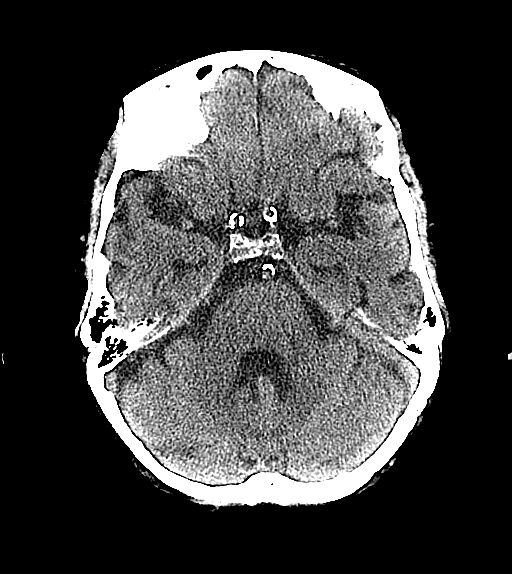
[im 38/84  brain]
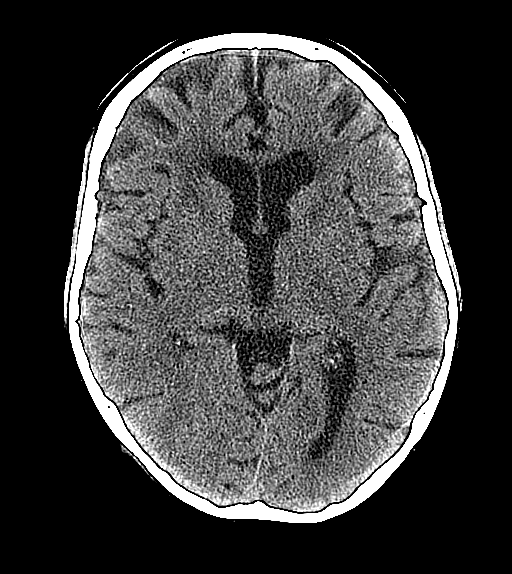
[im 46/84  brain]
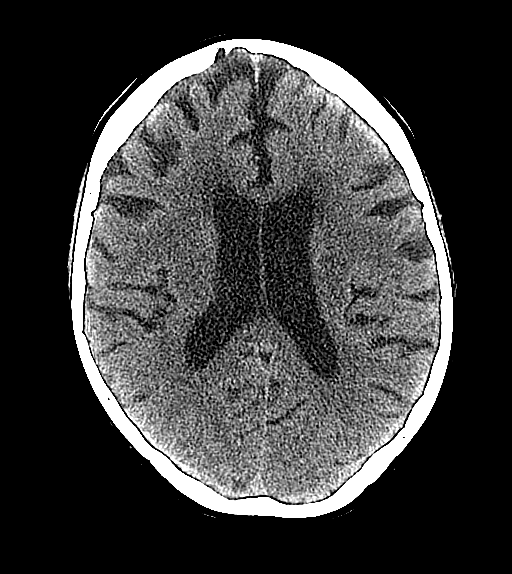
[im 46/84  bone]
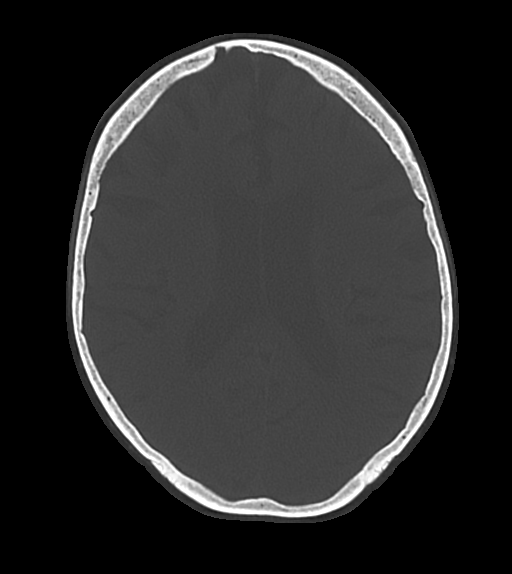
[im 59/84  brain]
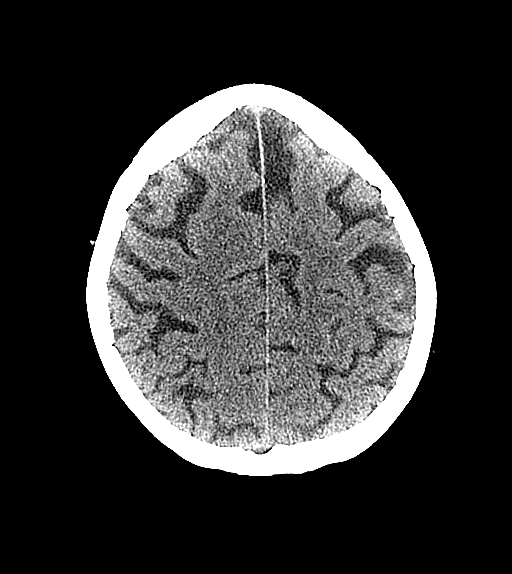
[im 67/84  brain]
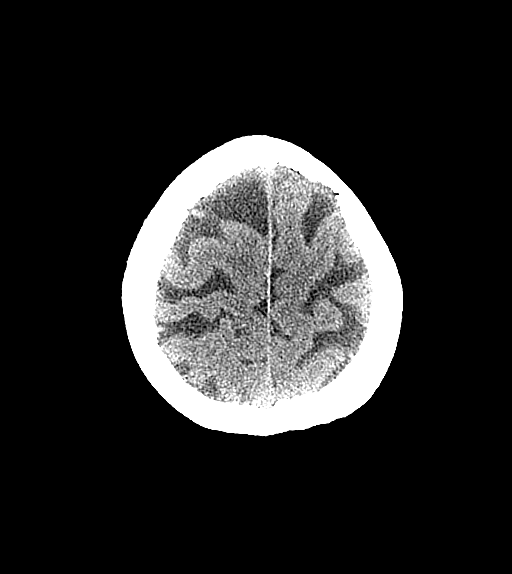
[im 75/84  brain]
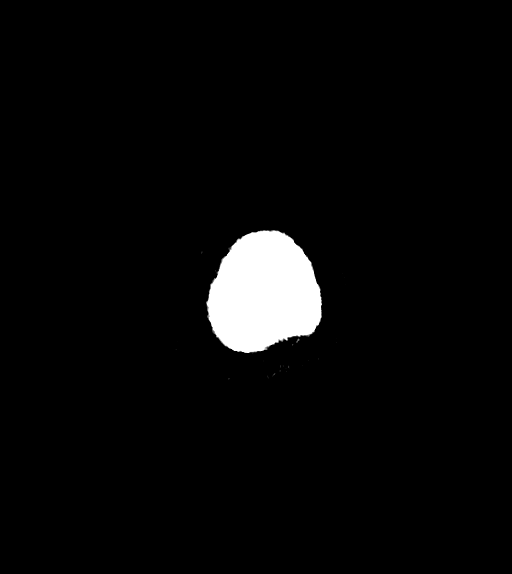

[14 of 47 positions shown; findings below may reference images not displayed]

FINDINGS: BRAIN:  NO evidence of supratentorial hemorrhage or mass.

NO evidence of posterior fossa/cerebellar hemorrhage or mass.

Minimal decreased attenuation in the cerebral white matter.

VENTRICLES:  Minimal ventricular enlargement.

BONES/JOINTS:  Calvarium and skull base demonstrate NO acute changes.

SOFT TISSUES:  Soft tissues demonstrate NO fluid collections or foreign body.

VASCULATURE:  Moderate to marked  arterial vascular calcification.

SINUSES:  The paranasal sinuses demonstrate NO significant opacifications or air-fluid levels.

MASTOID AIR CELLS:  The mastoid air cells demonstrate no significant opacification.
IMPRESSION: - NO evidence of acute intracranial hemorrhage or mass.

- Minimal ventricular enlargement.

- Minimal decreased attenuation in the cerebral white matter.  This is most consistent with
remote/small vessel ischemic changes.

_______________________________________________

DIAGNOSTIC STUDIES

EXAM:  CT CERVICAL SPINE WITHOUT INTRAVENOUS CONTRAST  ([id number])
FINDINGS: VERTEBRAE:  NO definite acute compression fractures.

There is straightening/reversal of the normal cervical lordosis.

Mild degenerative changes in the facets.

DISCS/SPINAL CANAL/NEURAL FORAMINA:  Multilevel foraminal narrowing.

Multilevel Disc X most prominent at C3-C4.

SOFT TISSUES:  Soft tissues demonstrate NO fluid collections or foreign body.

VASCULATURE:  Moderate carotid arterial vascular calcification. If indicated, specific examination
should be obtained.

RETROPHARYNGEAL SPACE:  The prevertebral/retropharyngeal space is unremarkable, NO definite
evidence of acute changes.

THYROID:  Calcifications within the thyroid.
IMPRESSION: - There is straightening/reversal of the normal cervical lordosis.  The differential includes
paraspinous muscle spasm, positioning and chronic changes secondary to cervical spondylosis. Please
correlate with the patient's symptoms.

- NO definite acute compression fractures.

- Moderate carotid arterial vascular calcification. If indicated, specific examination should be
obtained.

- Spondylosis with degenerative spinal changes as described.

- Calcifications within the thyroid.  Incomplete/limited evaluation. If indicated, follow-up should
be considered with ultrasound.

Tech Notes:

Fell while walking her dog and hit her head right above her left eye. CT/NM 0/0. TB

## 2021-07-25 IMAGING — CT SPCERVWO
3 of 4 series · 14 of 47 positions shown, 16 images · non-contrast
Comparison: No relevant prior studies available.
COMPARISON: No relevant prior studies available.

DIAGNOSTIC STUDIES

EXAM:  CT HEAD WITHOUT INTRAVENOUS CONTRAST  ([id number])
INDICATION: fall Fell while walking her dog and hit her head right above her left eye. CT/NM 0/0.
TB
TECHNIQUE: Axial computed tomography images of the head/brain without intravenous contrast.
Sagittal and coronal reformatted images were created and reviewed.
TECHNIQUE: Axial computed tomography images of the cervical spine without intravenous contrast.

[Series 4: c-spine cor 2.00 br60 s3 · coronal · 0.30mm/px · 3 of 77 slices shown]
[im 26/77  brain]
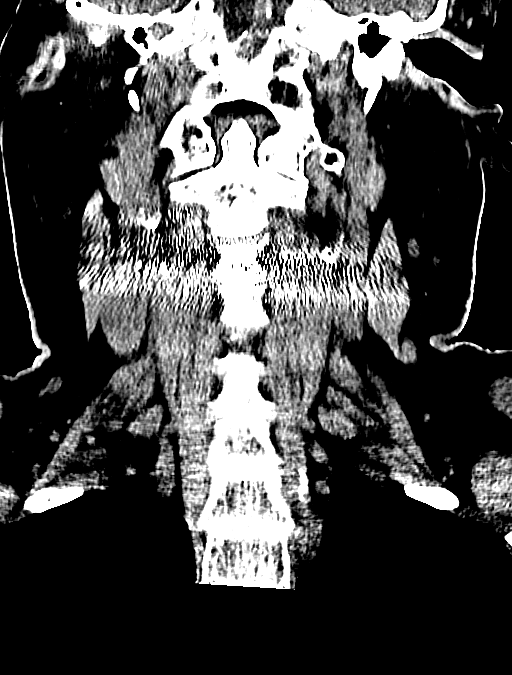
[im 34/77  brain]
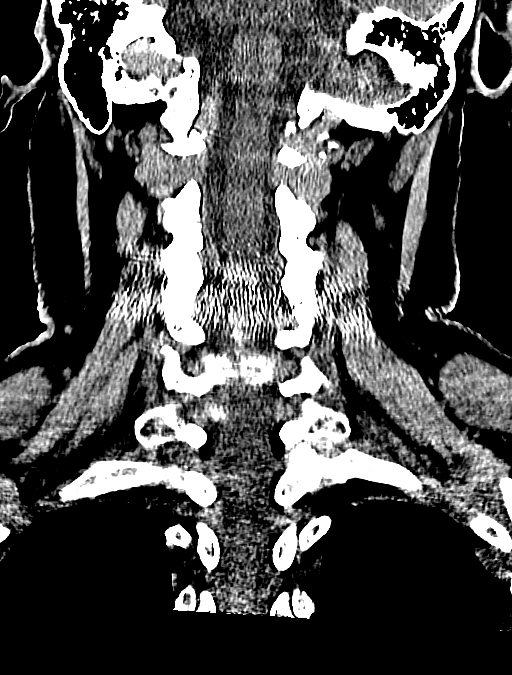
[im 43/77  brain]
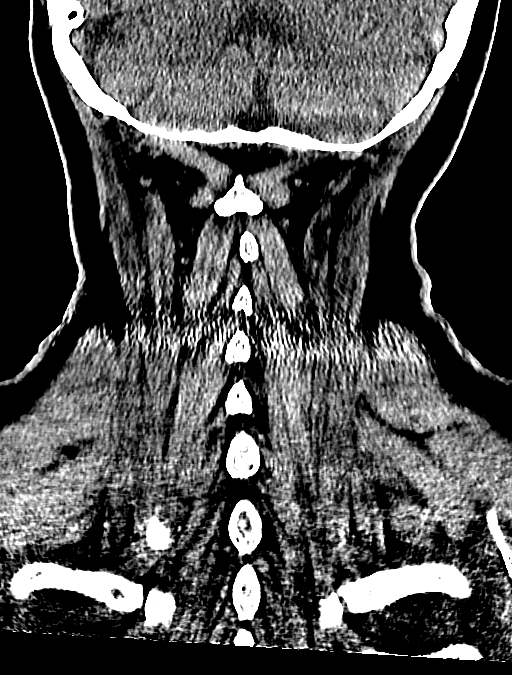

[Series 6: c-spine sag 2.00 br60 s3 · sagittal · 0.30mm/px · 3 of 76 slices shown]
[im 26/76  brain]
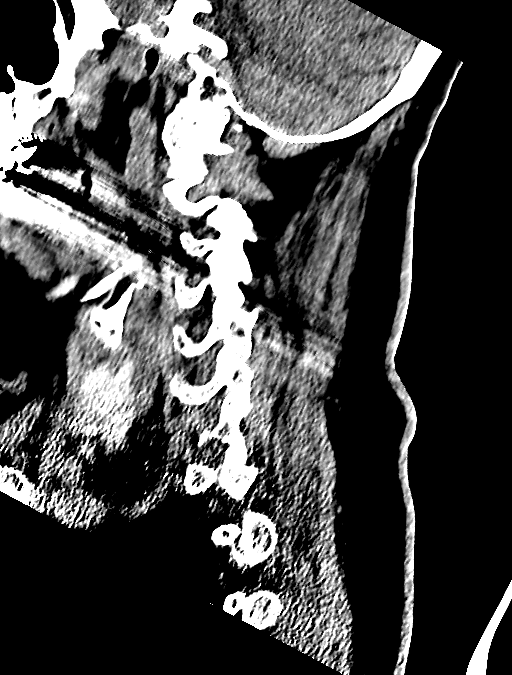
[im 38/76  brain]
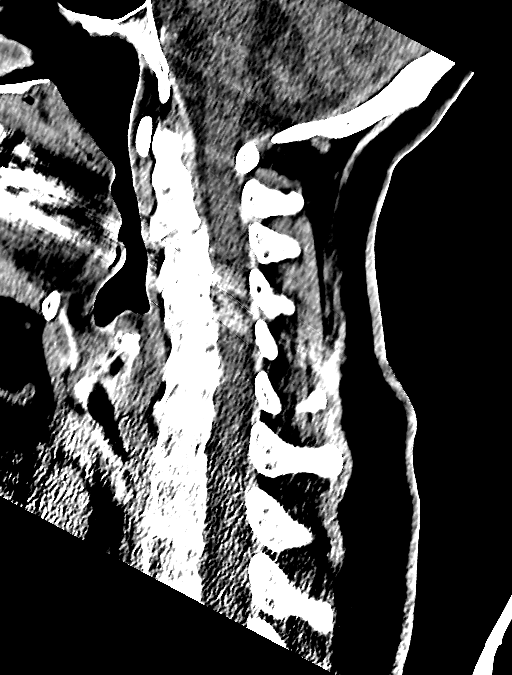
[im 51/76  brain]
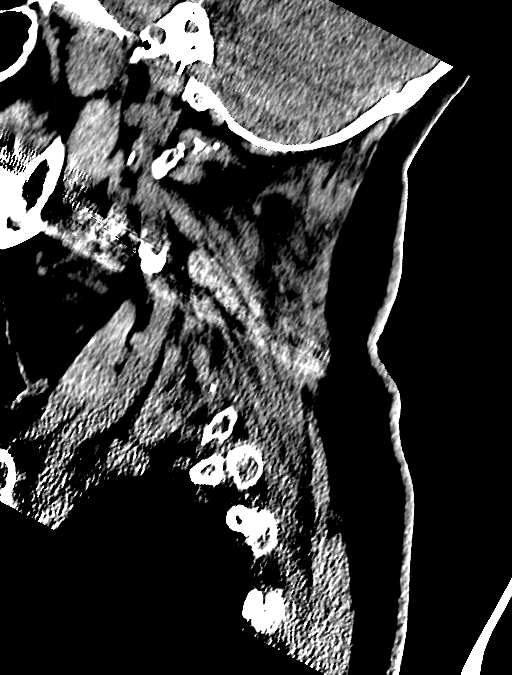

[Series 8: c-spine ax 2.00 br40 s3 · axial · 0.30mm/px · z∈[-720,-543]mm · 8 of 117 slices shown, 10 images]
[im 8/117  brain]
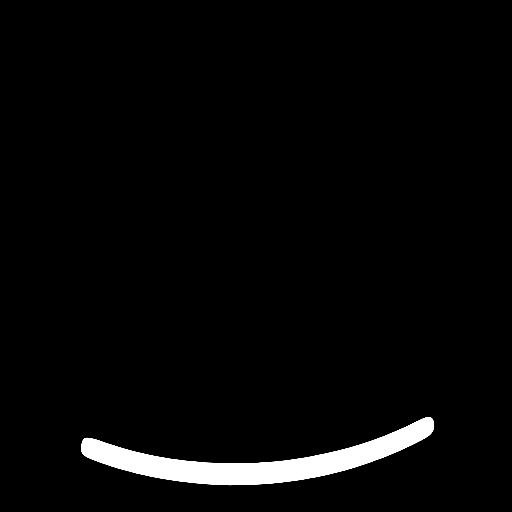
[im 8/117  bone]
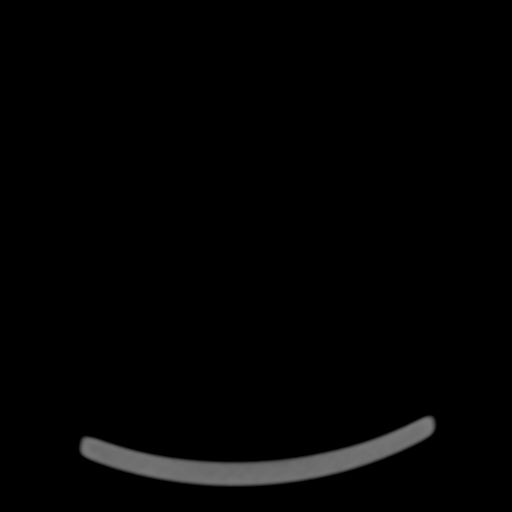
[im 24/117  brain]
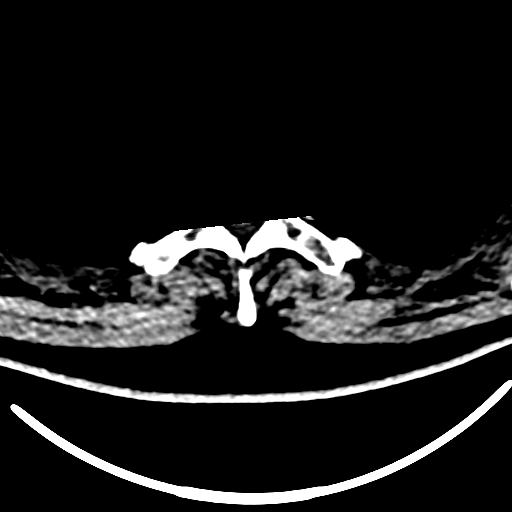
[im 39/117  brain]
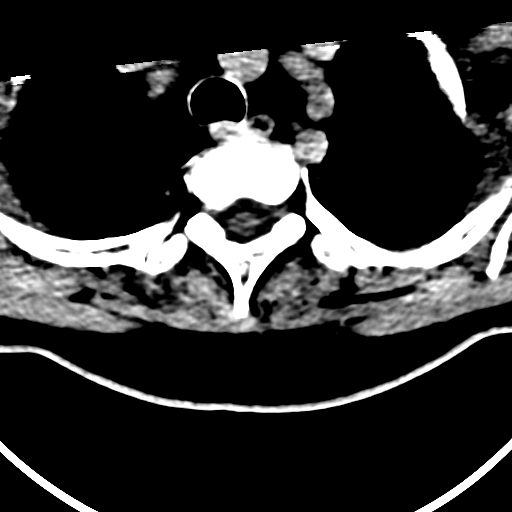
[im 55/117  brain]
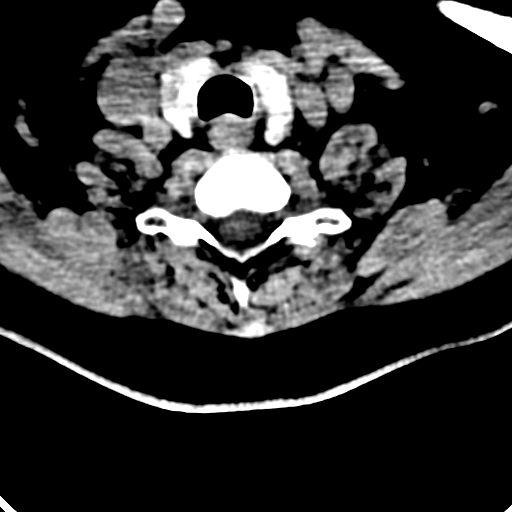
[im 62/117  brain]
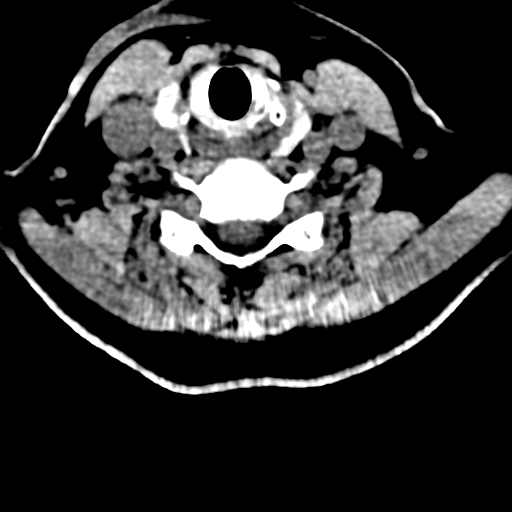
[im 62/117  bone]
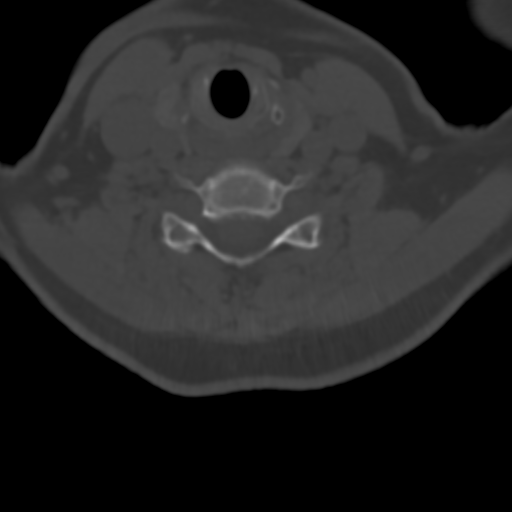
[im 78/117  brain]
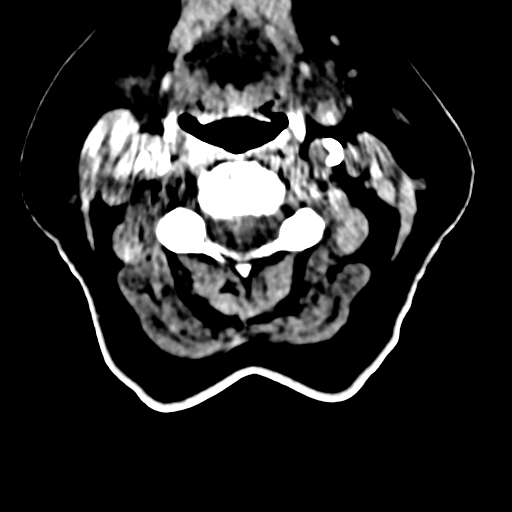
[im 93/117  brain]
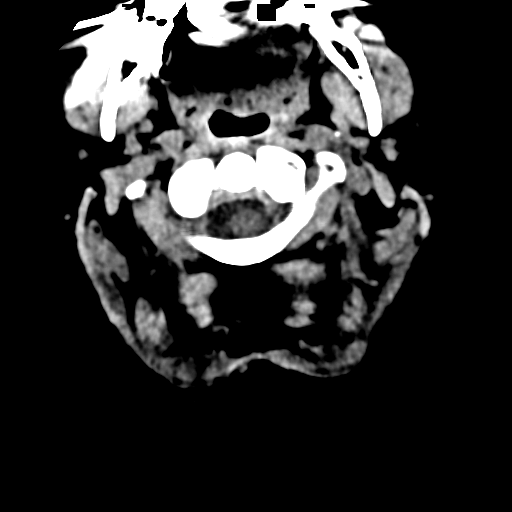
[im 109/117  brain]
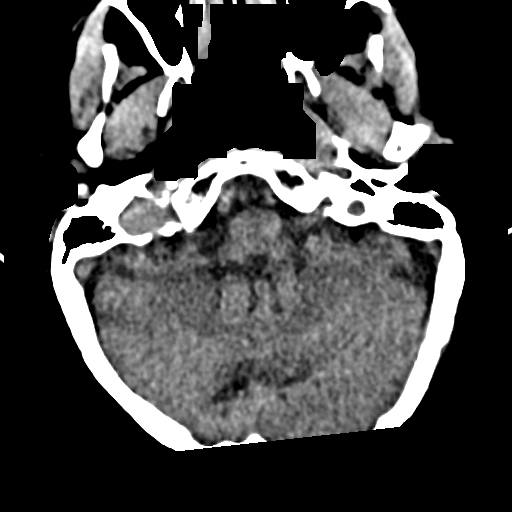

[14 of 47 positions shown; findings below may reference images not displayed]

FINDINGS: BRAIN:  NO evidence of supratentorial hemorrhage or mass.

NO evidence of posterior fossa/cerebellar hemorrhage or mass.

Minimal decreased attenuation in the cerebral white matter.

VENTRICLES:  Minimal ventricular enlargement.

BONES/JOINTS:  Calvarium and skull base demonstrate NO acute changes.

SOFT TISSUES:  Soft tissues demonstrate NO fluid collections or foreign body.

VASCULATURE:  Moderate to marked  arterial vascular calcification.

SINUSES:  The paranasal sinuses demonstrate NO significant opacifications or air-fluid levels.

MASTOID AIR CELLS:  The mastoid air cells demonstrate no significant opacification.
IMPRESSION: - NO evidence of acute intracranial hemorrhage or mass.

- Minimal ventricular enlargement.

- Minimal decreased attenuation in the cerebral white matter.  This is most consistent with
remote/small vessel ischemic changes.

_______________________________________________

DIAGNOSTIC STUDIES

EXAM:  CT CERVICAL SPINE WITHOUT INTRAVENOUS CONTRAST  ([id number])
FINDINGS: VERTEBRAE:  NO definite acute compression fractures.

There is straightening/reversal of the normal cervical lordosis.

Mild degenerative changes in the facets.

DISCS/SPINAL CANAL/NEURAL FORAMINA:  Multilevel foraminal narrowing.

Multilevel Disc X most prominent at C3-C4.

SOFT TISSUES:  Soft tissues demonstrate NO fluid collections or foreign body.

VASCULATURE:  Moderate carotid arterial vascular calcification. If indicated, specific examination
should be obtained.

RETROPHARYNGEAL SPACE:  The prevertebral/retropharyngeal space is unremarkable, NO definite
evidence of acute changes.

THYROID:  Calcifications within the thyroid.
IMPRESSION: - There is straightening/reversal of the normal cervical lordosis.  The differential includes
paraspinous muscle spasm, positioning and chronic changes secondary to cervical spondylosis. Please
correlate with the patient's symptoms.

- NO definite acute compression fractures.

- Moderate carotid arterial vascular calcification. If indicated, specific examination should be
obtained.

- Spondylosis with degenerative spinal changes as described.

- Calcifications within the thyroid.  Incomplete/limited evaluation. If indicated, follow-up should
be considered with ultrasound.

Tech Notes:

Fell while walking her dog and has complaints of neck pain. CT/NM 0/0. TB

## 2021-08-05 IMAGING — CR SHOULDCMLT
3 series · 3 of 3 positions shown · non-contrast
Comparison: none

[shoulder external]
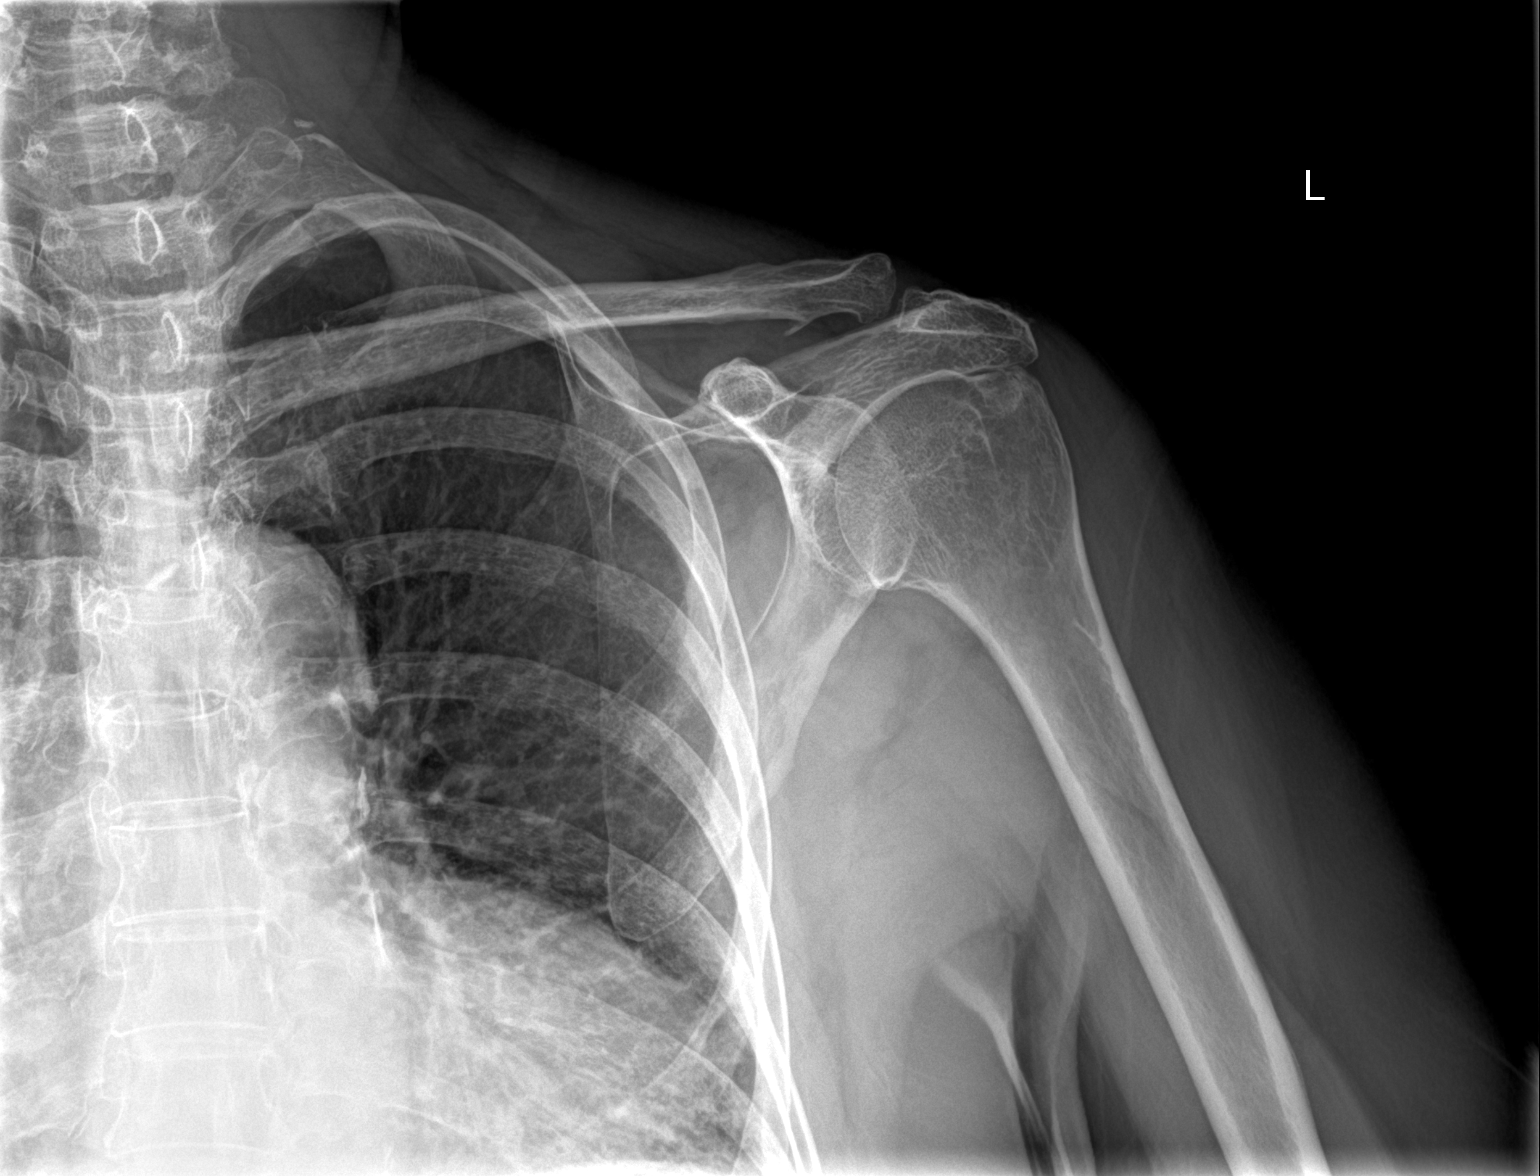

[shoulder internal]
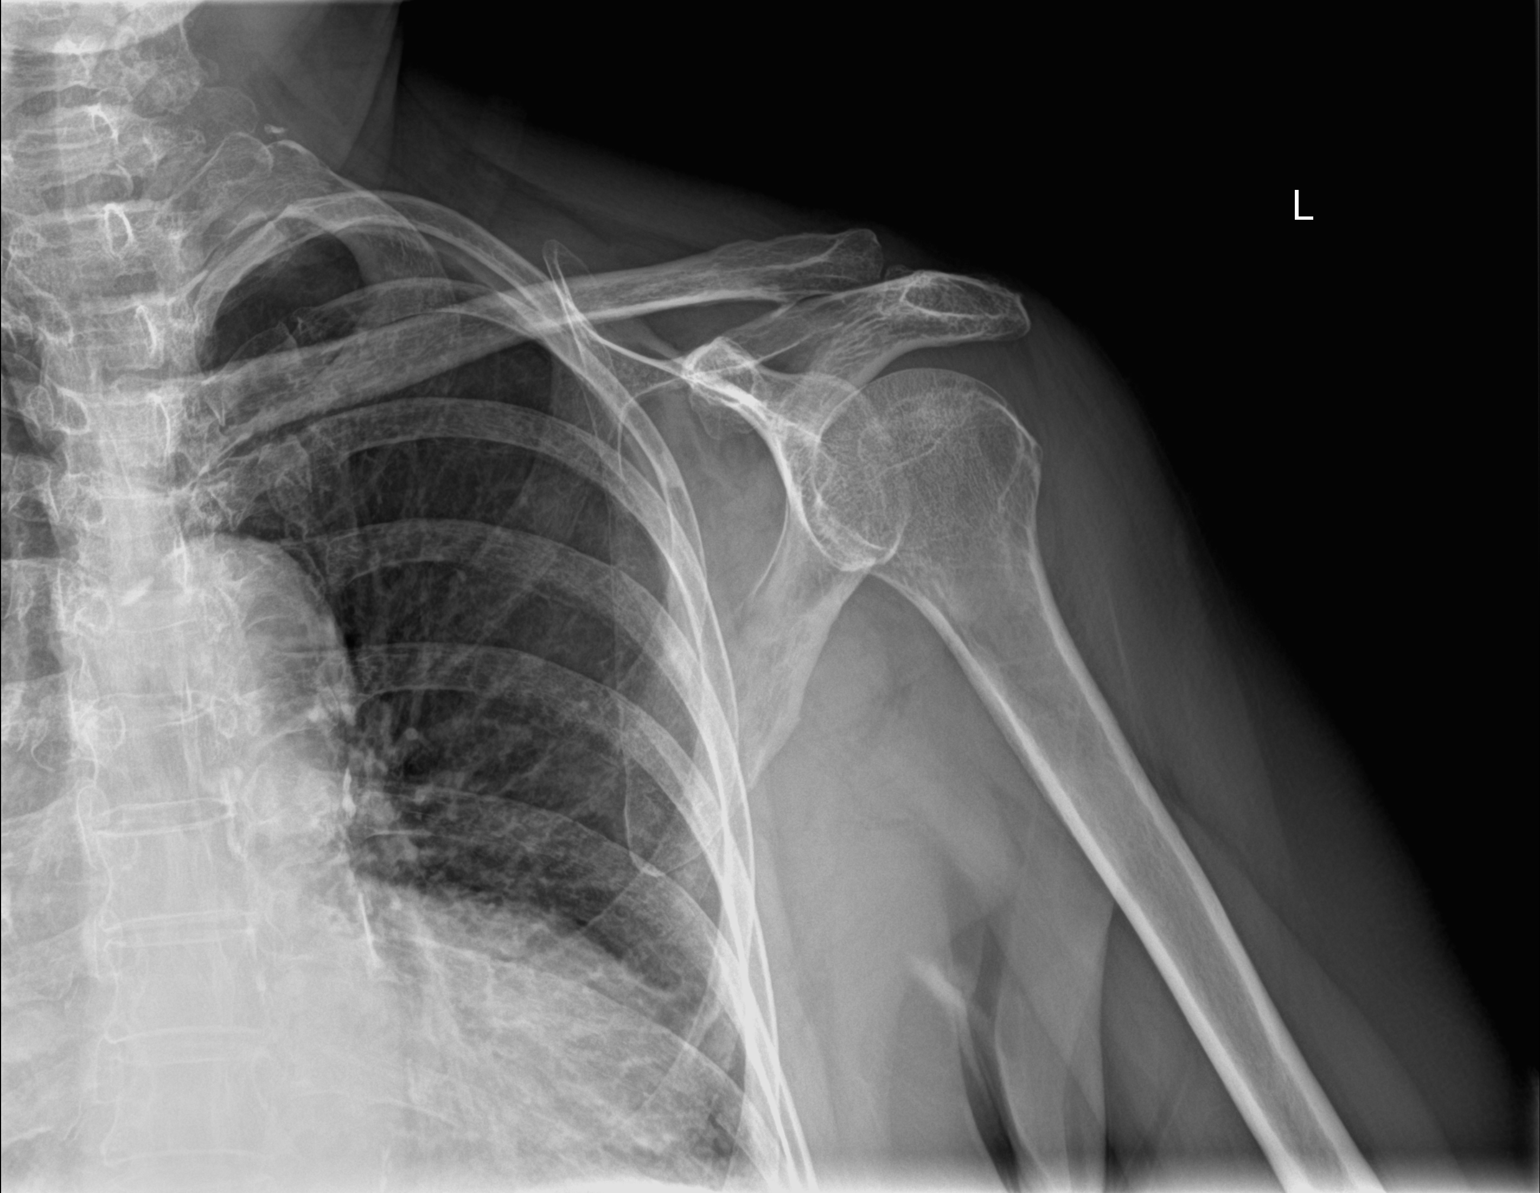

[shoulder y-view]
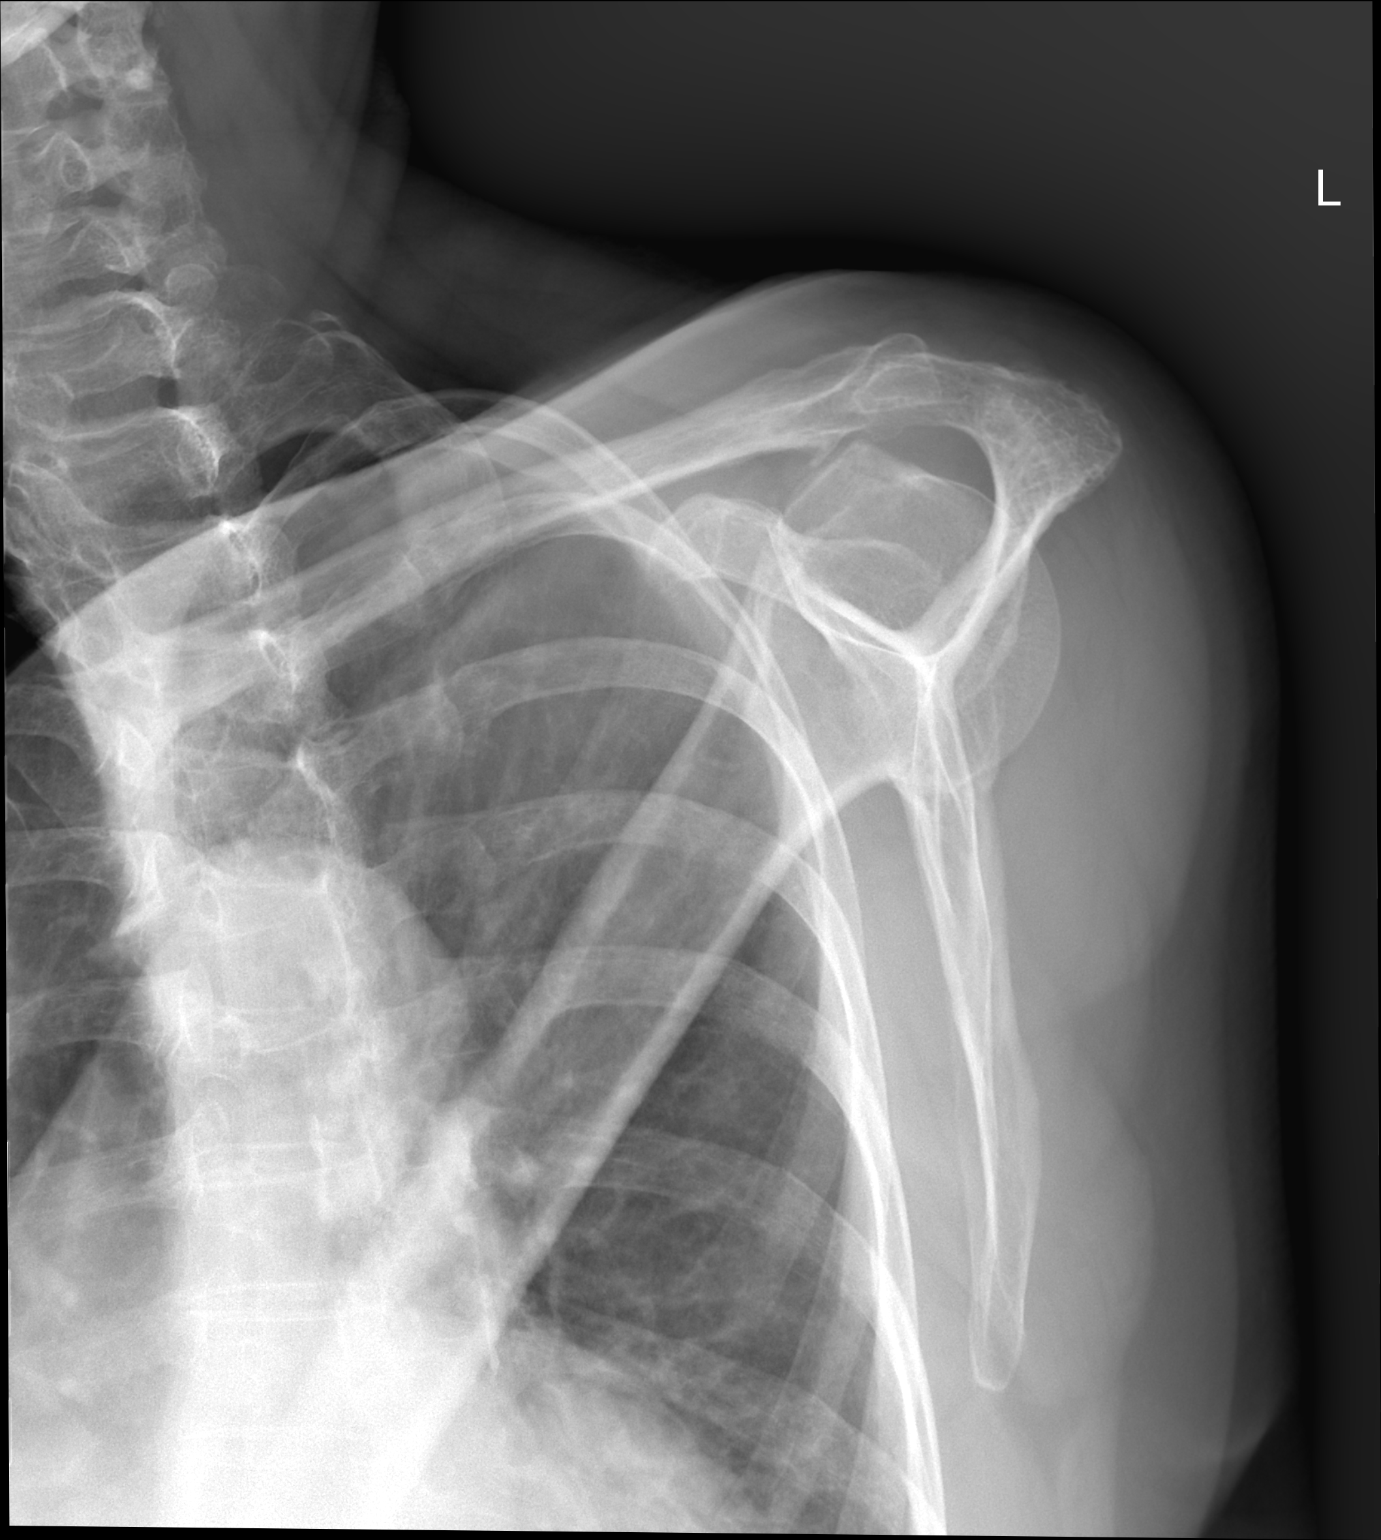

[3 of 3 positions shown; findings below may reference images not displayed]

DIAGNOSTIC STUDIES

EXAM

XR shoulder left, complete

INDICATION

Fall, lft shoulder pain
fall 1 wk ago; lt shoulder pain

TECHNIQUE

Internal external rotated views and lateral views

COMPARISONS

None available

FINDINGS

Degenerative changes of the AC joint are evident. No fractures are seen.

IMPRESSION

Degenerative changes of the AC joint. No fractures are evident.

Tech Notes:

fall 1 wk ago; lt shoulder pain

## 2021-08-05 IMAGING — CR WRISTCMLT
3 series · 3 of 3 positions shown · non-contrast
Comparison: none

[wrist lat]
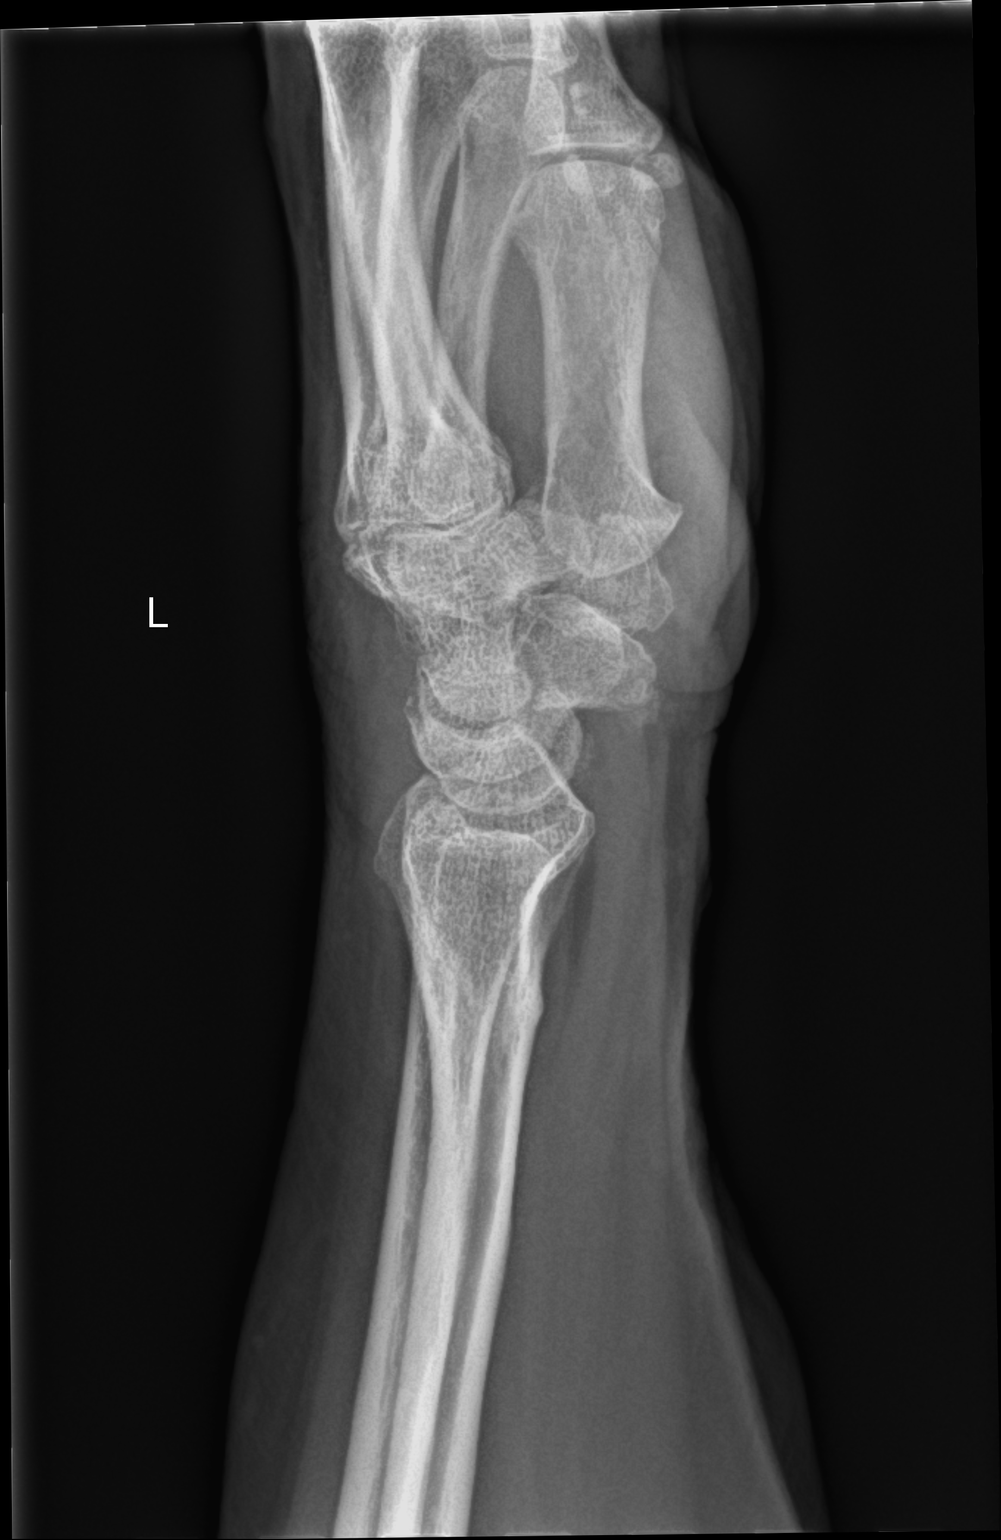

[wrist pa]
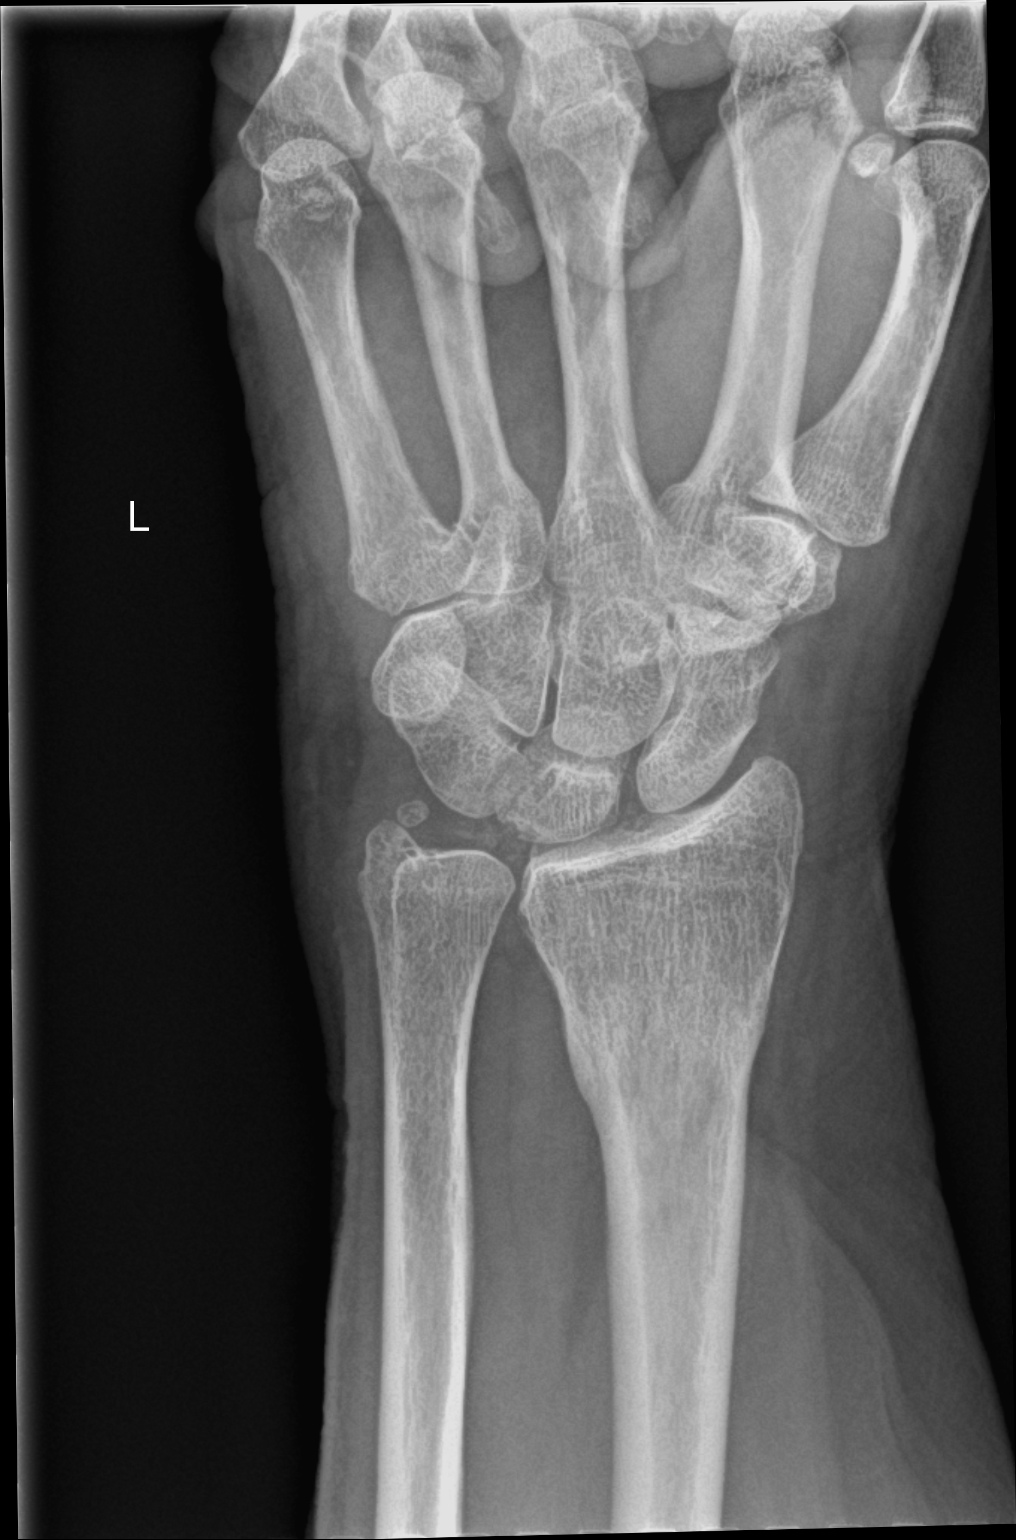

[wrist obl]
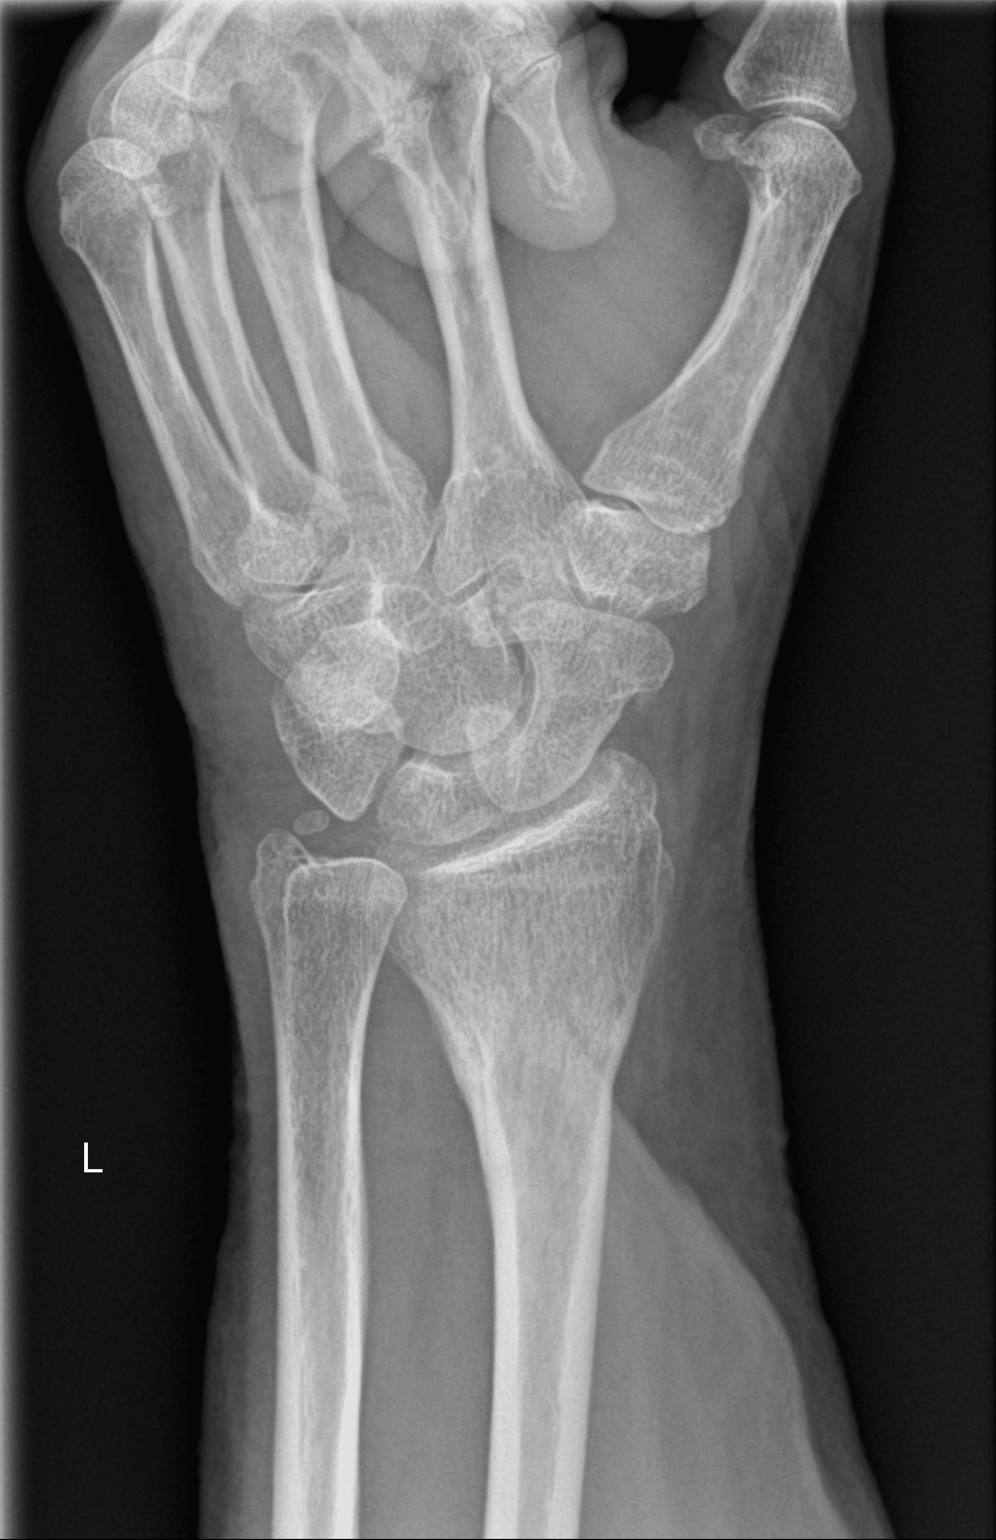

[3 of 3 positions shown; findings below may reference images not displayed]

DIAGNOSTIC STUDIES

EXAM

XR wrist LT min 3V

INDICATION

Left hand/wrist pain
fall 1 wk ago; lt medial wirst pain

TECHNIQUE

AP lateral and oblique views

COMPARISONS

None available

FINDINGS

No fractures or dislocations are seen. Degenerative changes of the 1st carpometacarpal joint space
are evident. There is chondrocalcinosis.

IMPRESSION

Chondrocalcinosis and mild degenerative changes. No fractures are seen.

Tech Notes:

fall 1 wk ago; lt medial wirst pain

## 2021-08-05 IMAGING — CR [ID]
3 series · 3 of 3 positions shown · non-contrast
Comparison: none

[hand pa]
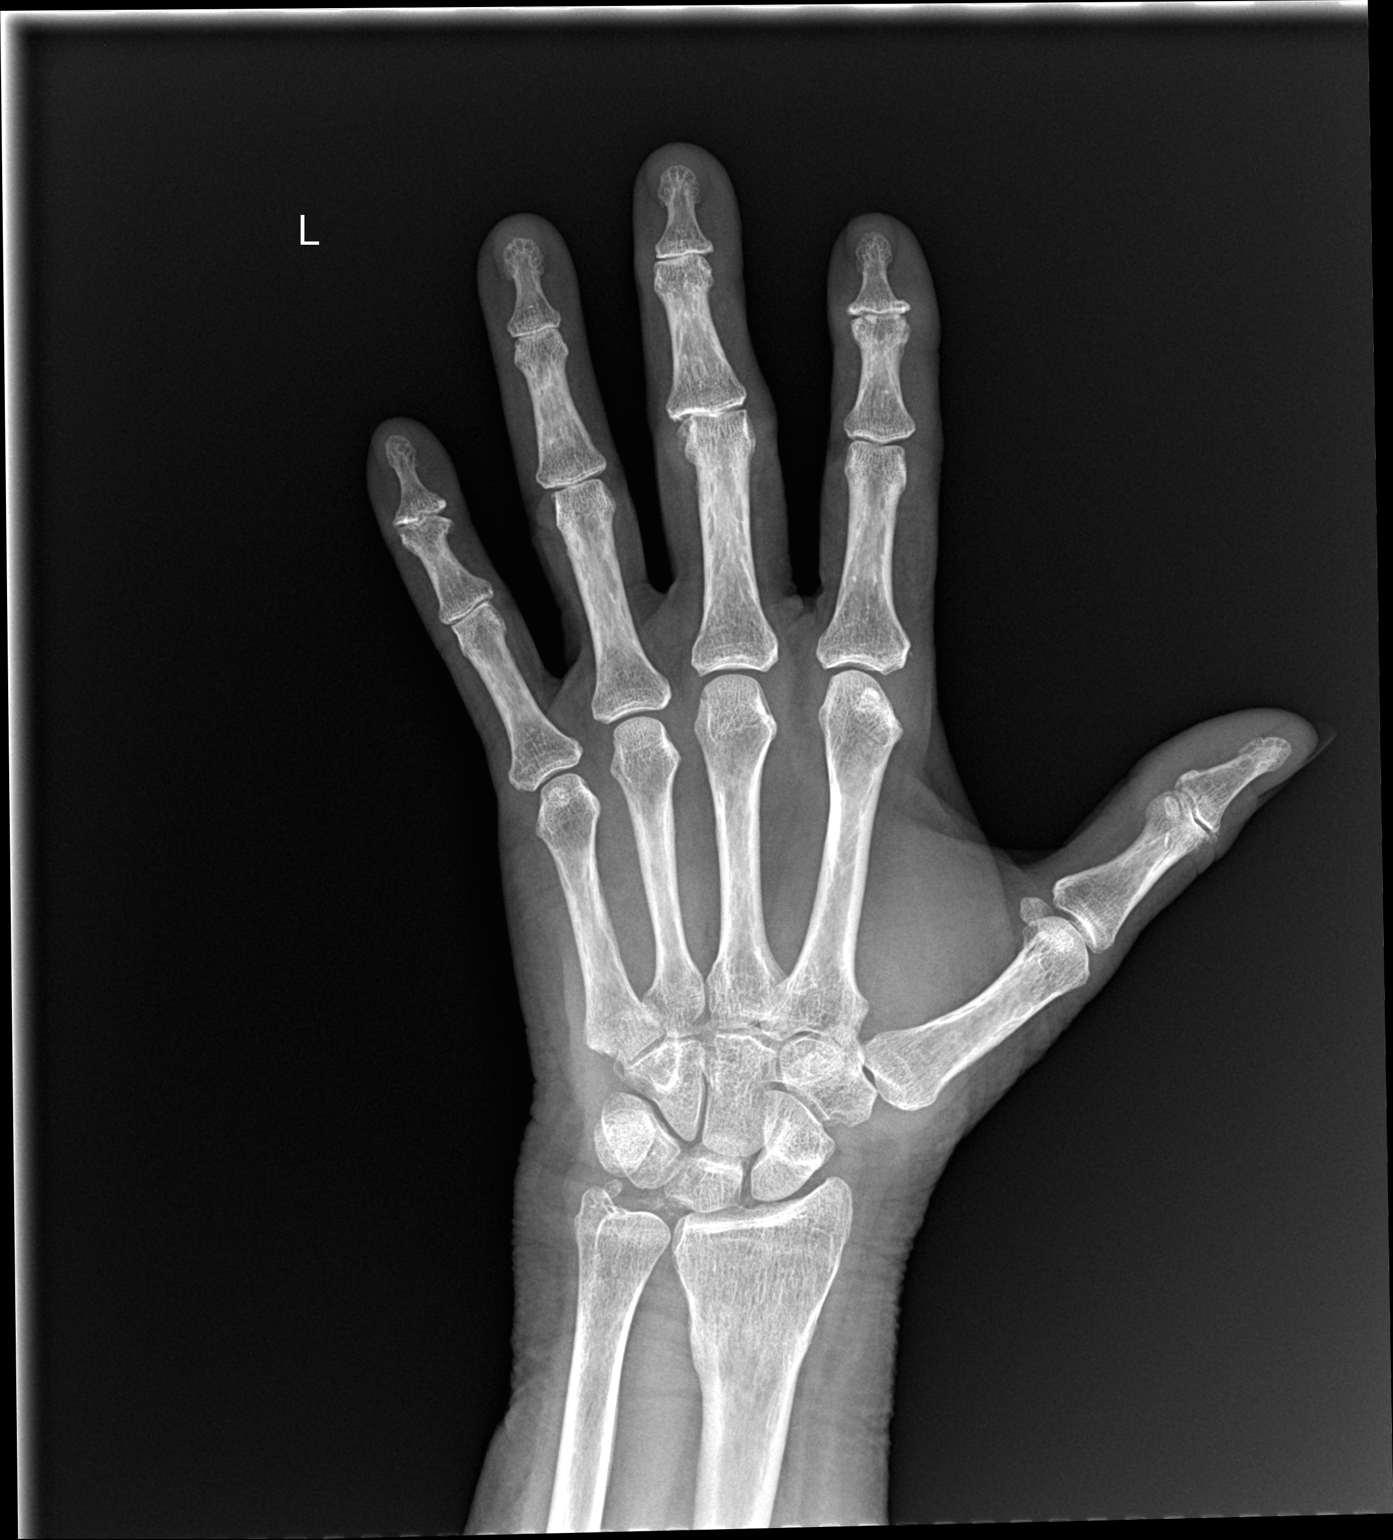

[hand obl]
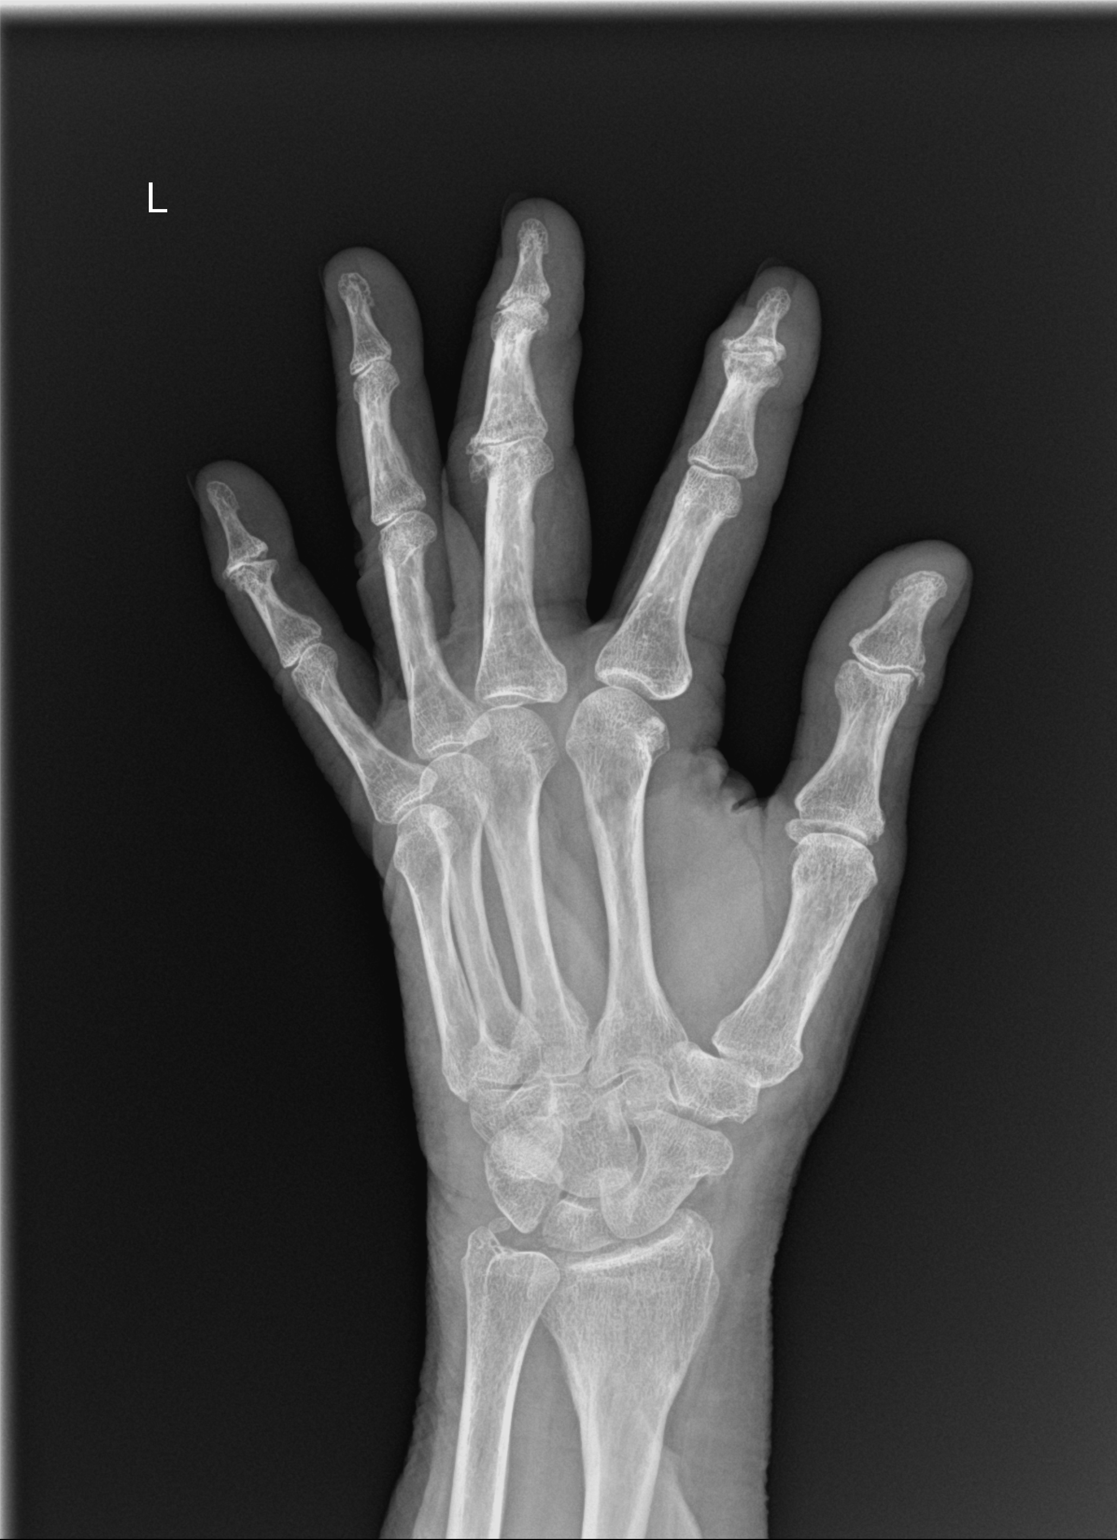

[hand lat]
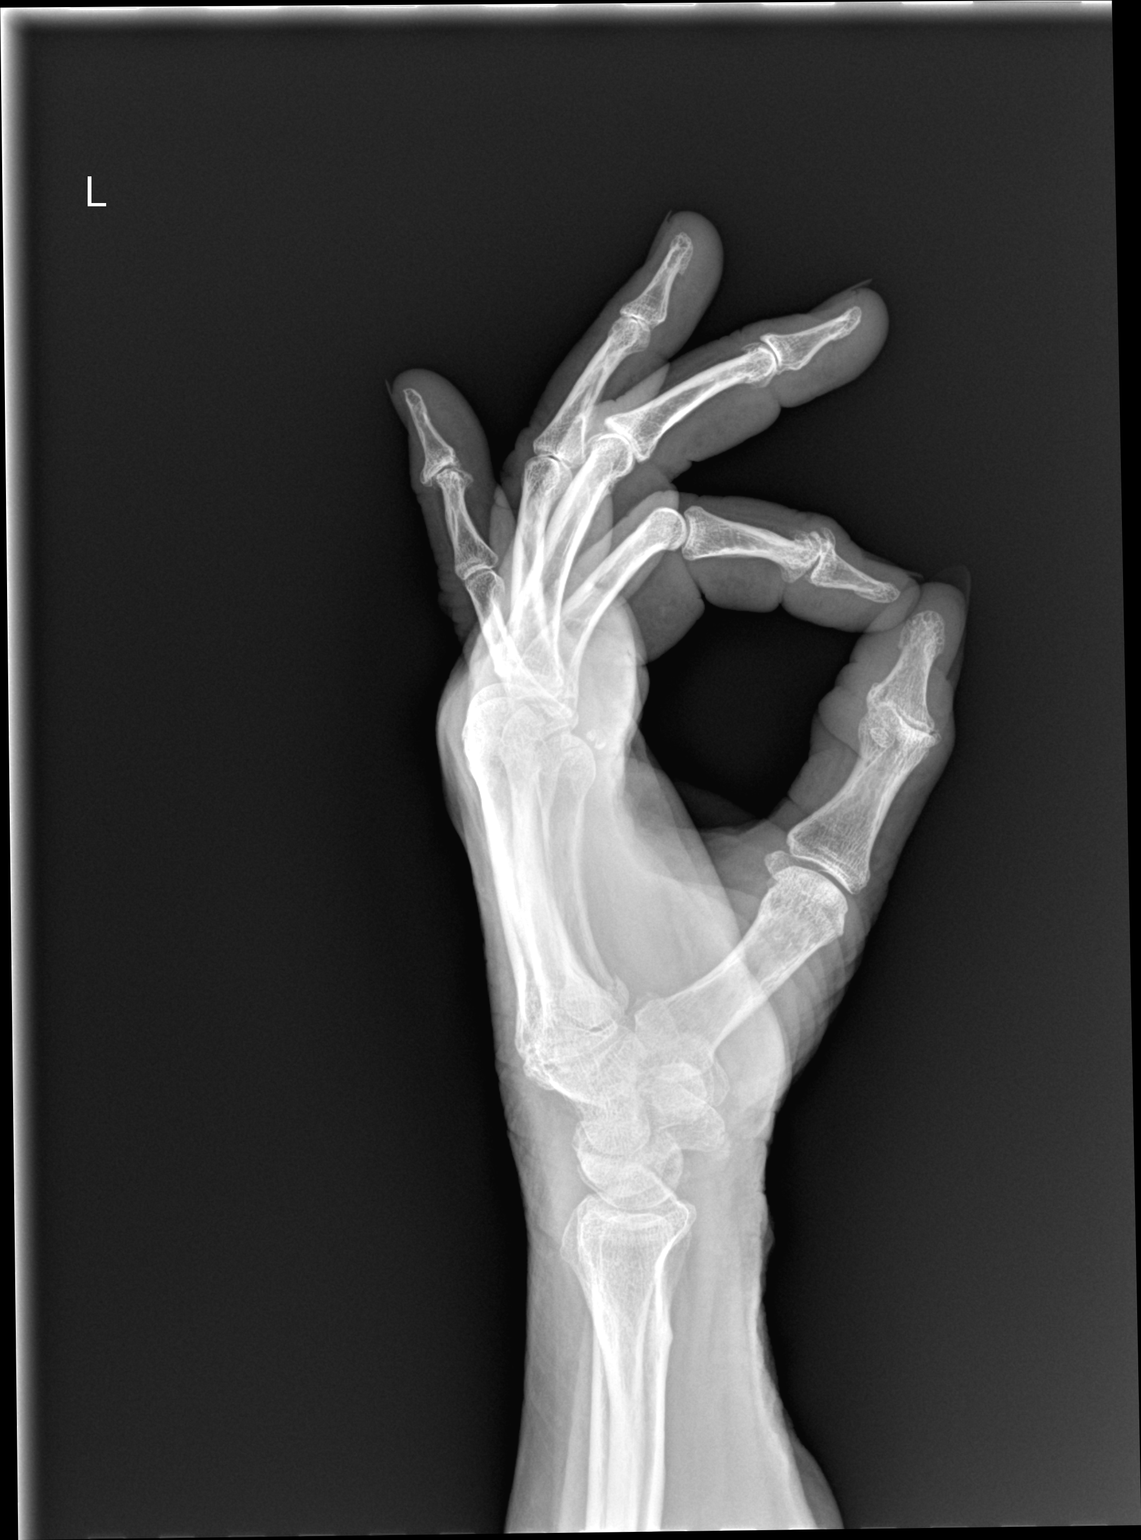

[3 of 3 positions shown; findings below may reference images not displayed]

DIAGNOSTIC STUDIES

EXAM

XR hand LT min 3V

INDICATION

Left hand pain, fall
fall 1 wk ago; lt hand pain; most pain in 5th metacarapal area

TECHNIQUE

AP lateral and oblique views

COMPARISONS

None available

FINDINGS

Osteoarthritic changes of the DIP and PIP joints of the 2nd through 5th digits is evident. There is
mild degenerative changes of the 1st DIP joint. No fractures are seen.

IMPRESSION

Osteoarthritic changes as described. No fractures are evident.

Tech Notes:

fall 1 wk ago; lt hand pain; most pain in 5th metacarapal area

## 2021-11-25 ENCOUNTER — Encounter: Admit: 2021-11-25 | Discharge: 2021-11-25 | Payer: MEDICARE

## 2021-11-25 IMAGING — CT AGHEAD
1 series · 12 of 14 positions shown, 15 images · non-contrast
Comparison: none

[Series 10: cta brain and/or neck 10.00 bv36 s3 · axial · 0.48mm/px · z∈[-745,-477]mm · 12 of 160 slices shown, 15 images]
[im 13/160  soft-tissue]
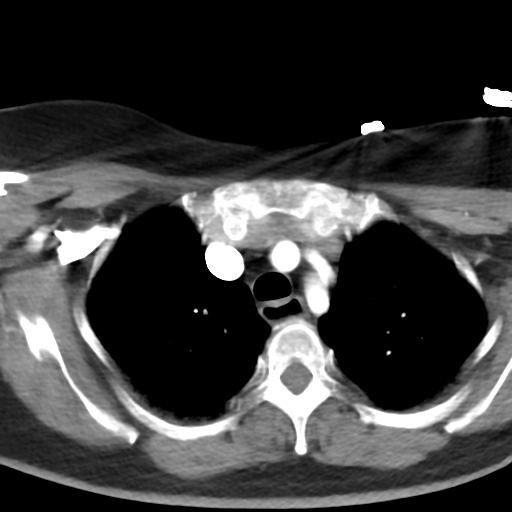
[im 13/160  bone]
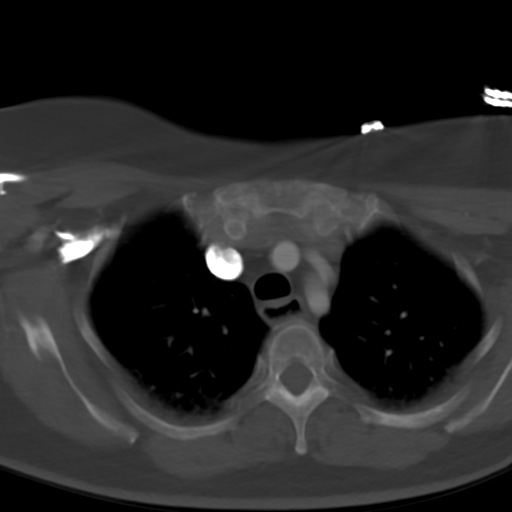
[im 25/160  bone]
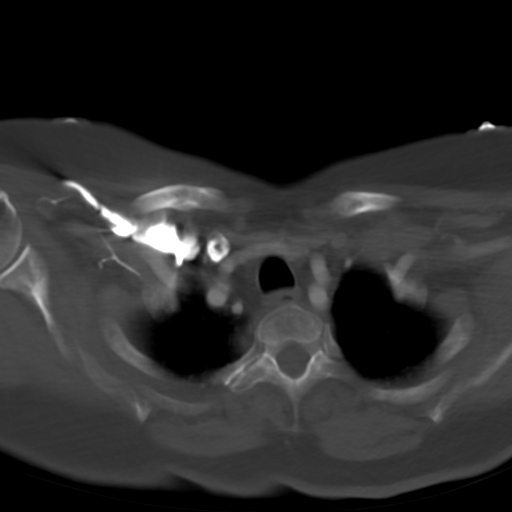
[im 37/160  bone]
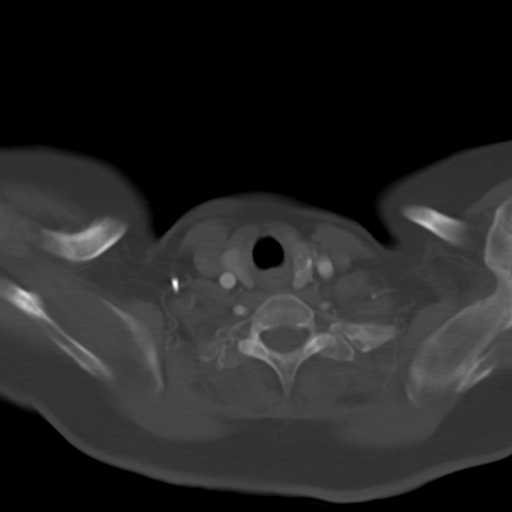
[im 49/160  bone]
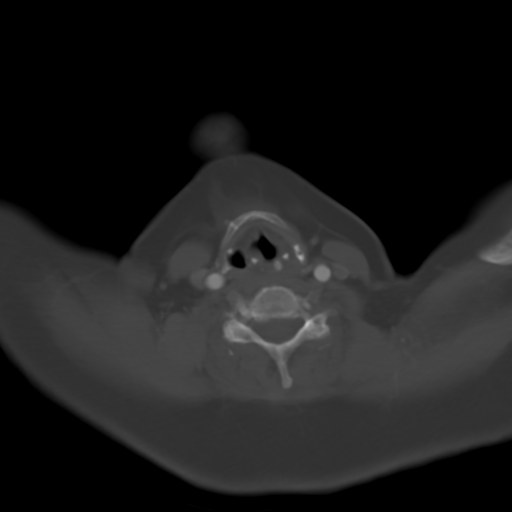
[im 62/160  soft-tissue]
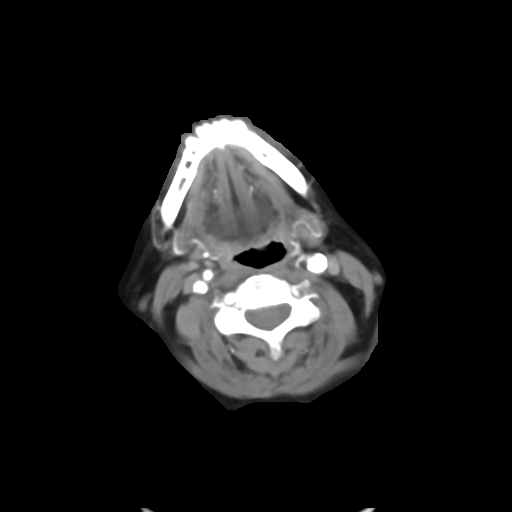
[im 62/160  bone]
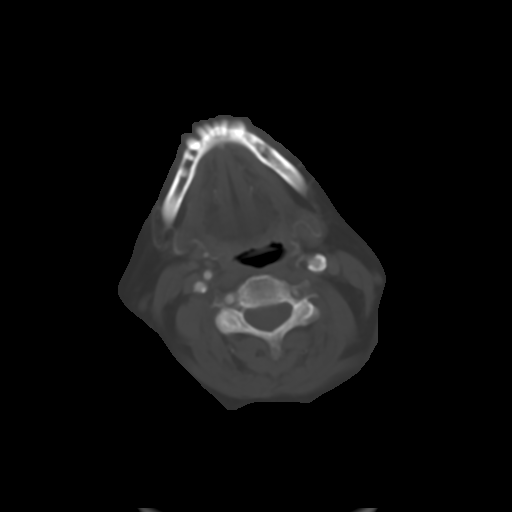
[im 74/160  bone]
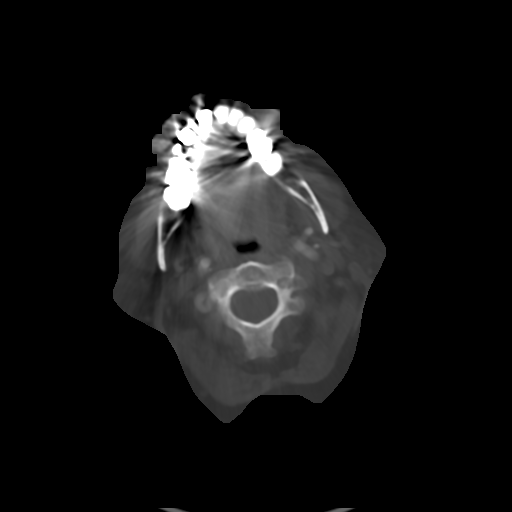
[im 86/160  bone]
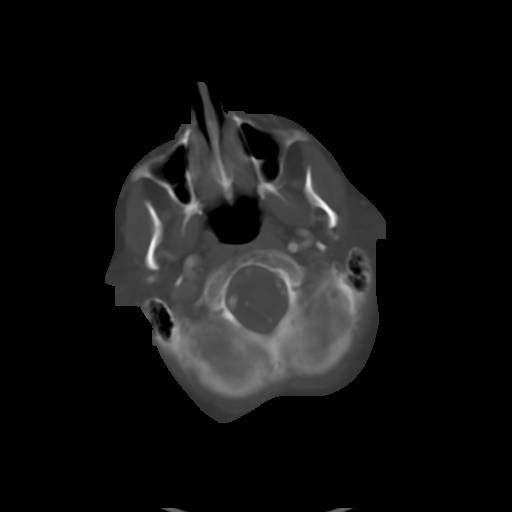
[im 98/160  bone]
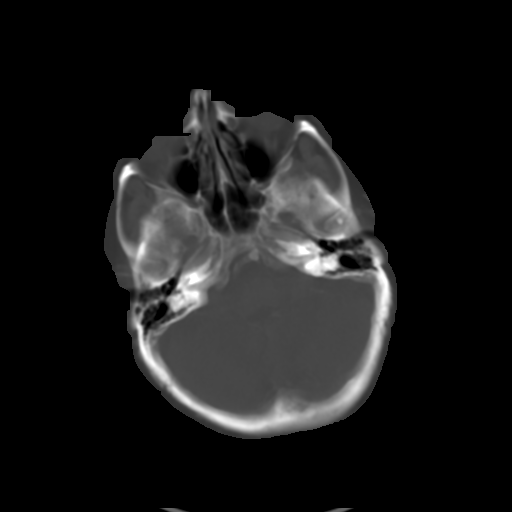
[im 111/160  soft-tissue]
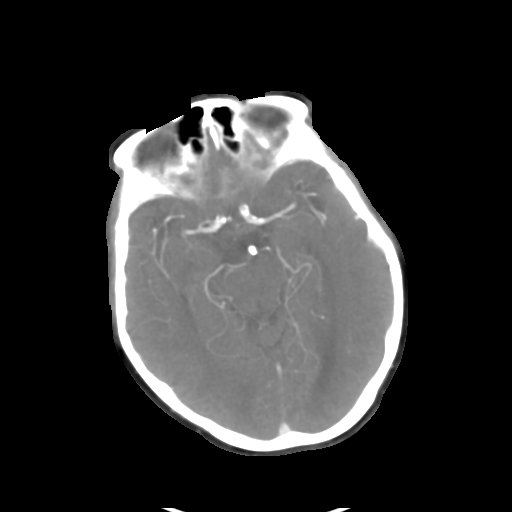
[im 111/160  bone]
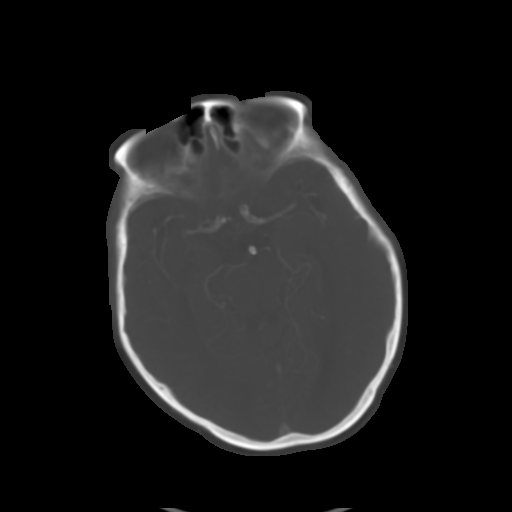
[im 123/160  bone]
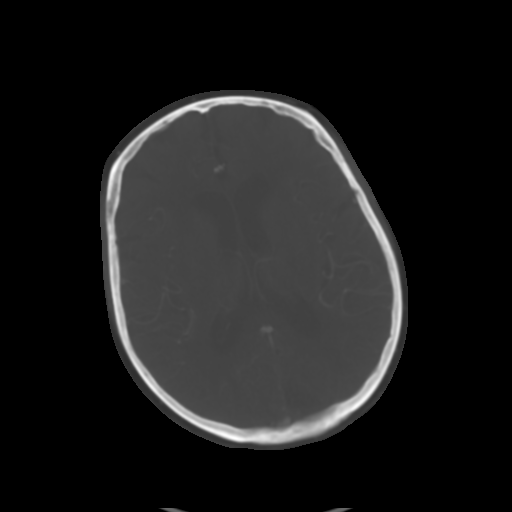
[im 135/160  bone]
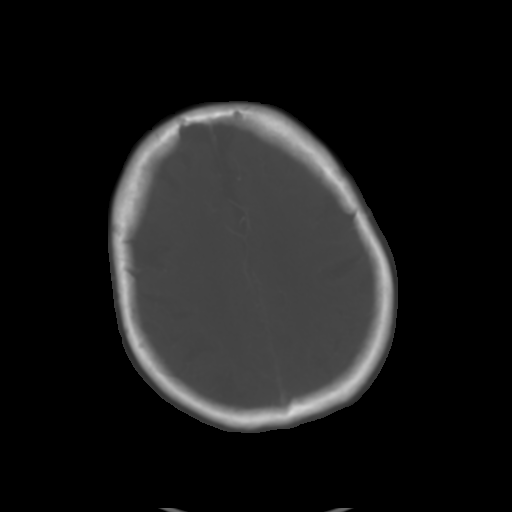
[im 147/160  bone]
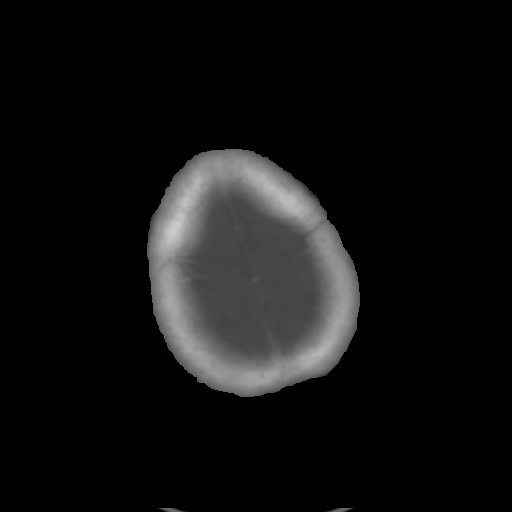

[12 of 14 positions shown; findings below may reference images not displayed]

DIAGNOSTIC STUDIES

EXAM

CT angiogram of the head neck.

INDICATION

left sided weakness
DIZZINESS, LEFT SIDED WEAKNESS, ACUTE ONSET. GFR 61.4, 100 ML OMNIPAQUE 350. CT.NM 3/0

TECHNIQUE

All CT scans at this facility use dose modulation, iterative reconstruction, and/or weight based
dosing when appropriate to reduce radiation dose to as low as reasonably achievable.

Number of previous computed tomography exams in the last 12 months is 3.

Number of previous nuclear medicine myocardial perfusion studies in the last 12 months is 0 .

COMPARISONS

None available

FINDINGS

There is diffuse calcified plaque involving the left carotid bulb with overall narrowing estimated
to be 50 percent by diameter. Three-vessel cord is noted. Mild non restrictive calcified plaque is
seen in the proximal right internal carotid artery. There is dense arterial calcification of the
basilar and cavernous carotid arteries. There is no evidence for aneurysm or other vascular
malformation. No evidence for vessel cutoff is seen. No abnormal enhancement is noted throughout the
brain.

IMPRESSION

Calcified plaque at the left carotid bulb with narrowing estimated to be approximately 50 percent
by diameter.

Additional calcified plaques seen throughout the remaining vasculature as described. Report called
to [place] in the ER at 2 p.m..

Tech Notes:

DIZZINESS, LEFT SIDED WEAKNESS, ACUTE ONSET.  GFR 61.4, 100 ML OMNIPAQUE 350.  CT.NM 3/0

## 2021-11-25 IMAGING — CT AGNECK
3 of 4 series · 12 of 34 positions shown, 14 images · non-contrast
Comparison: none

[Series 504: cta brain and/or neck cor 3.00 bv36 s3 · coronal · 0.44mm/px · 3 of 70 slices shown]
[im 14/70  bone]
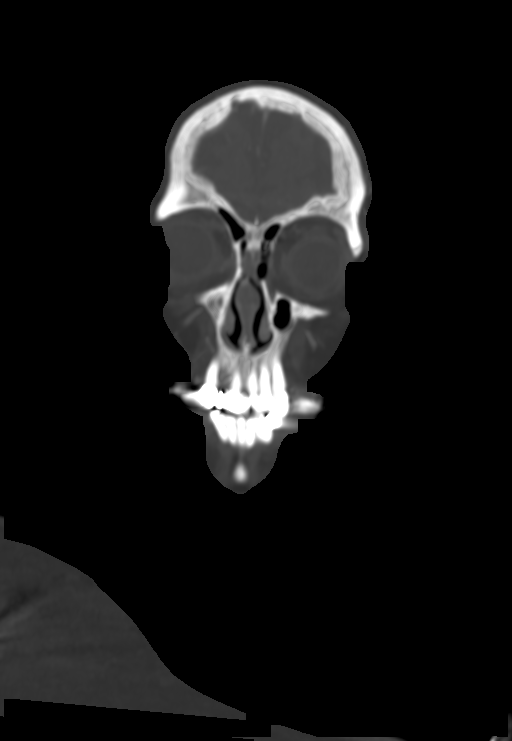
[im 28/70  bone]
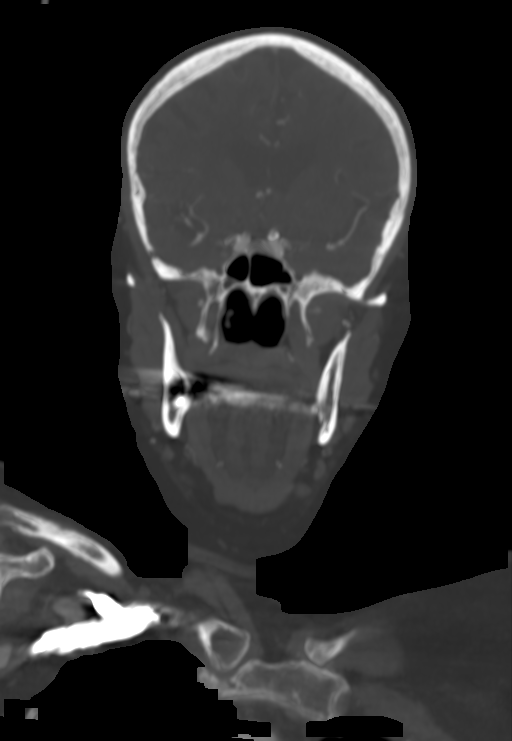
[im 42/70  bone]
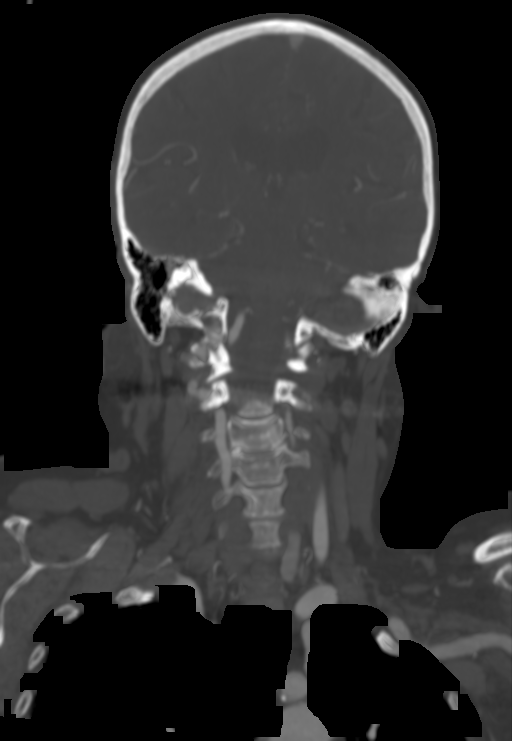

[Series 505: cta brain and/or neck sag 3.00 bv36 s3 · sagittal · 0.41mm/px · 5 of 75 slices shown, 6 images]
[im 25/75  bone]
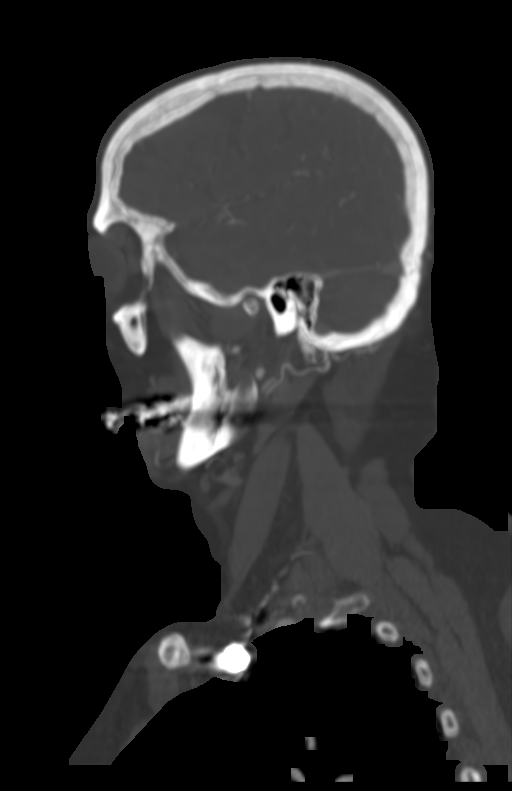
[im 31/75  bone]
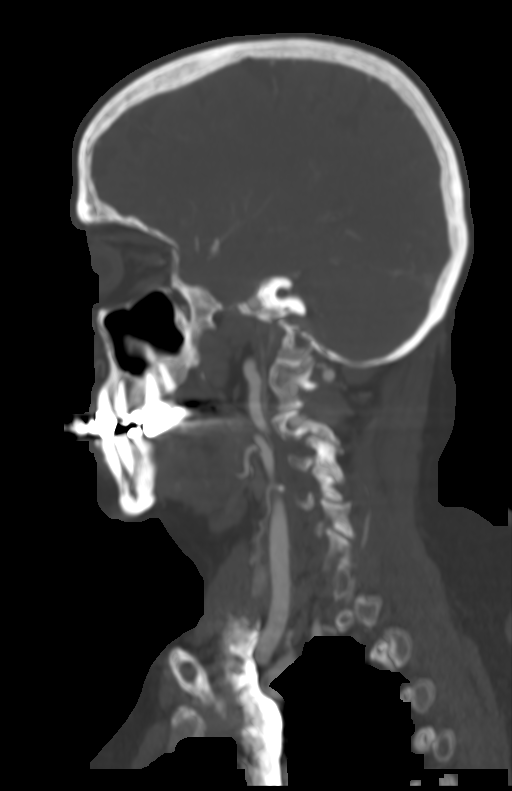
[im 38/75  soft-tissue]
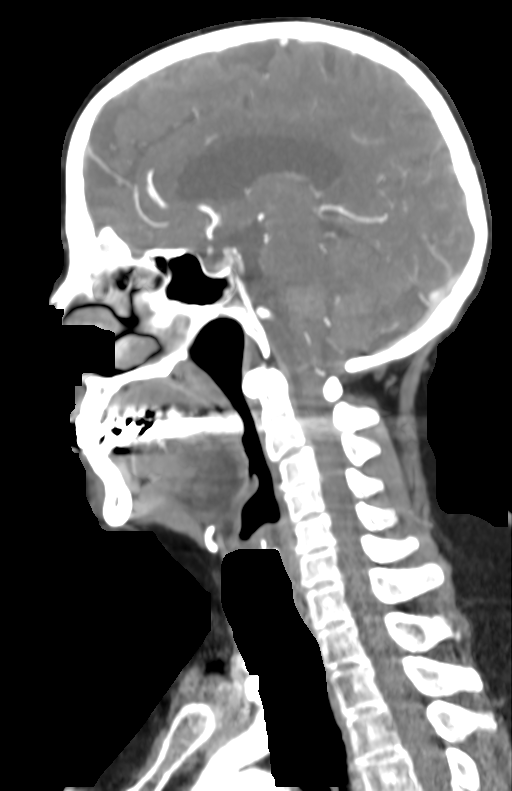
[im 38/75  bone]
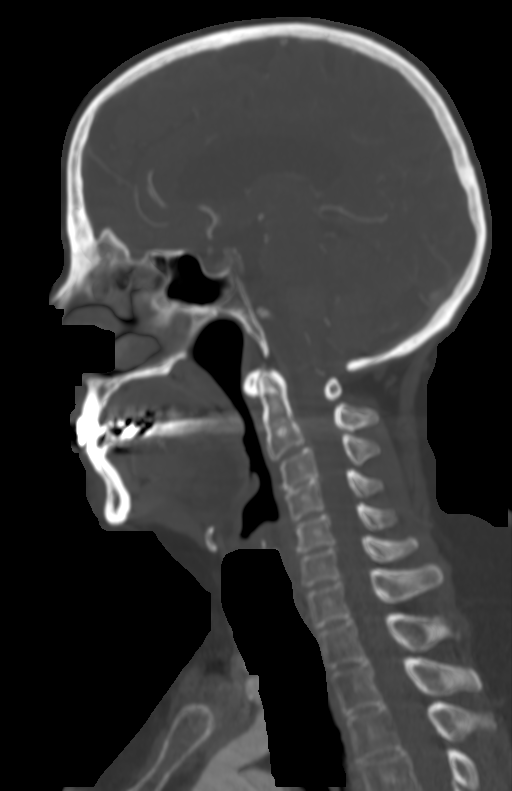
[im 44/75  bone]
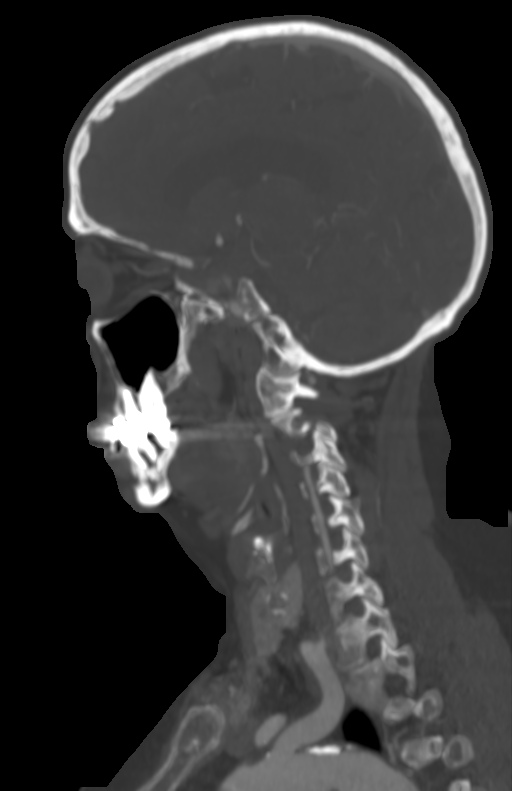
[im 50/75  bone]
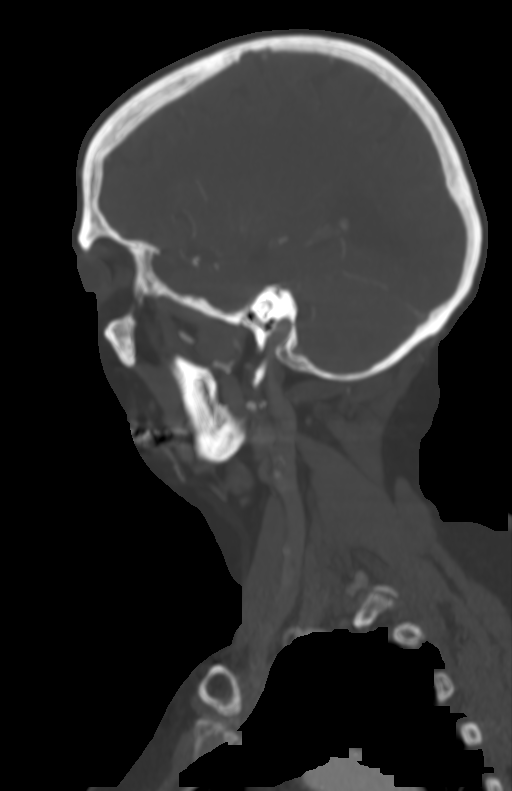

[Series 506: cta brain and/or neck 1.00 bv36 s3 · axial · 0.44mm/px · z∈[-710,-512]mm · 4 of 409 slices shown, 5 images]
[im 82/409  soft-tissue]
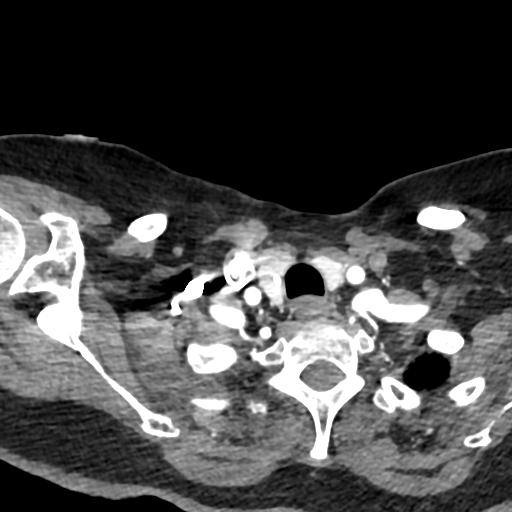
[im 82/409  bone]
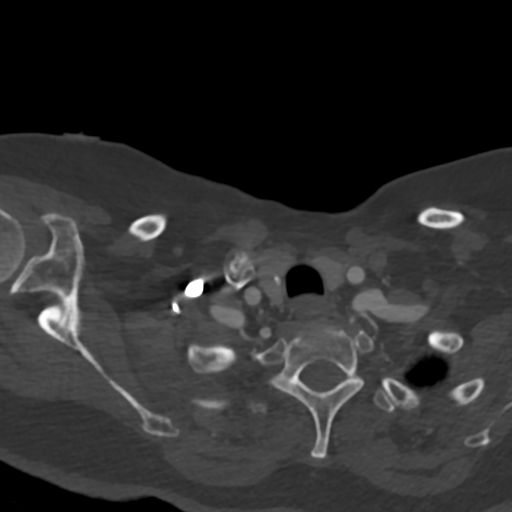
[im 164/409  bone]
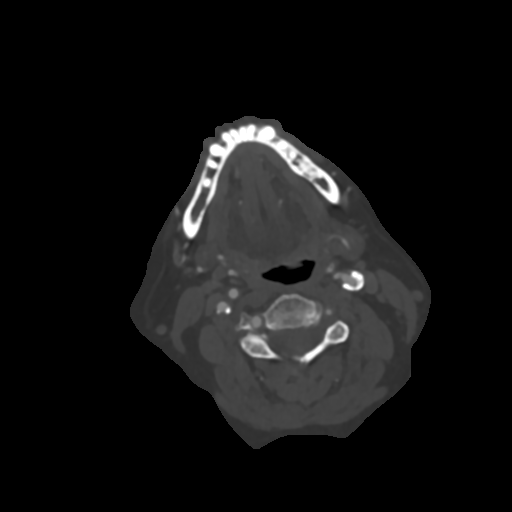
[im 245/409  bone]
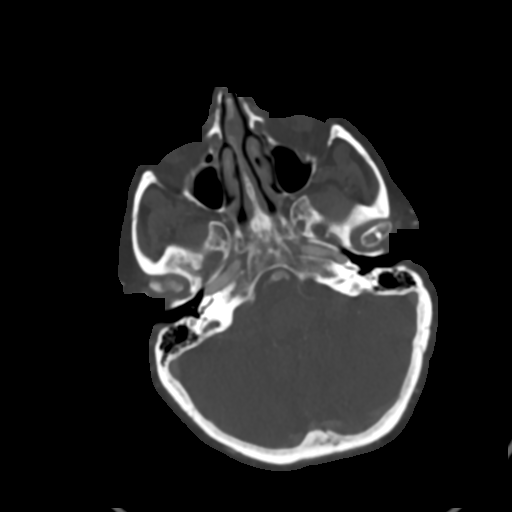
[im 327/409  bone]
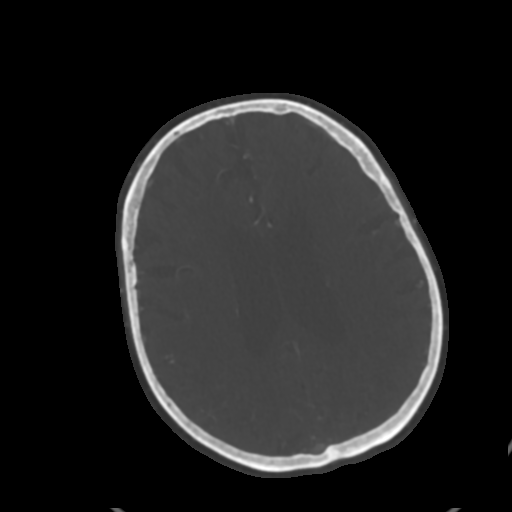

[12 of 34 positions shown; findings below may reference images not displayed]

DIAGNOSTIC STUDIES

EXAM

CT angiogram of the head neck.

INDICATION

left sided weakness
DIZZINESS, LEFT SIDED WEAKNESS, ACUTE ONSET. GFR 61.4, 100 ML OMNIPAQUE 350. CT.NM 3/0

TECHNIQUE

All CT scans at this facility use dose modulation, iterative reconstruction, and/or weight based
dosing when appropriate to reduce radiation dose to as low as reasonably achievable.

Number of previous computed tomography exams in the last 12 months is 3.

Number of previous nuclear medicine myocardial perfusion studies in the last 12 months is 0 .

COMPARISONS

None available

FINDINGS

There is diffuse calcified plaque involving the left carotid bulb with overall narrowing estimated
to be 50 percent by diameter. Three-vessel cord is noted. Mild non restrictive calcified plaque is
seen in the proximal right internal carotid artery. There is dense arterial calcification of the
basilar and cavernous carotid arteries. There is no evidence for aneurysm or other vascular
malformation. No evidence for vessel cutoff is seen. No abnormal enhancement is noted throughout the
brain.

IMPRESSION

Calcified plaque at the left carotid bulb with narrowing estimated to be approximately 50 percent
by diameter.

Additional calcified plaques seen throughout the remaining vasculature as described. Report called
to [place] in the ER at 2 p.m..

Tech Notes:

DIZZINESS, LEFT SIDED WEAKNESS, ACUTE ONSET.  GFR 61.4, 100 ML OMNIPAQUE 350.  CT.NM 3/0

## 2021-11-25 IMAGING — CT CT head/brain wo con
3 of 4 series · 14 of 47 positions shown, 16 images · non-contrast
Comparison: none

[Series 4: brain cor 5.00 hr40 s3 · coronal · 0.31mm/px · 3 of 39 slices shown]
[im 13/39  brain]
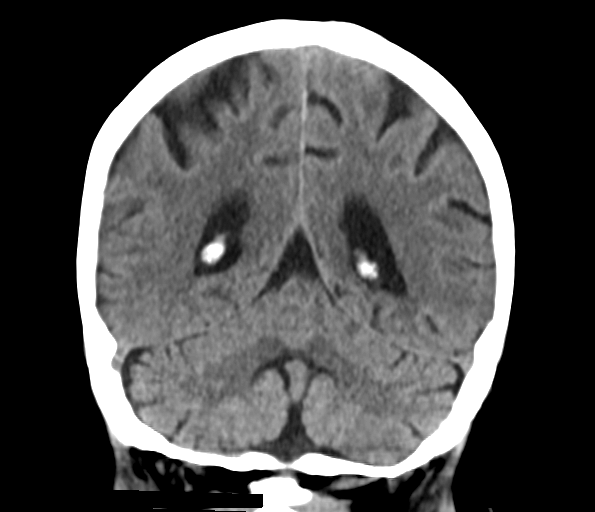
[im 17/39  brain]
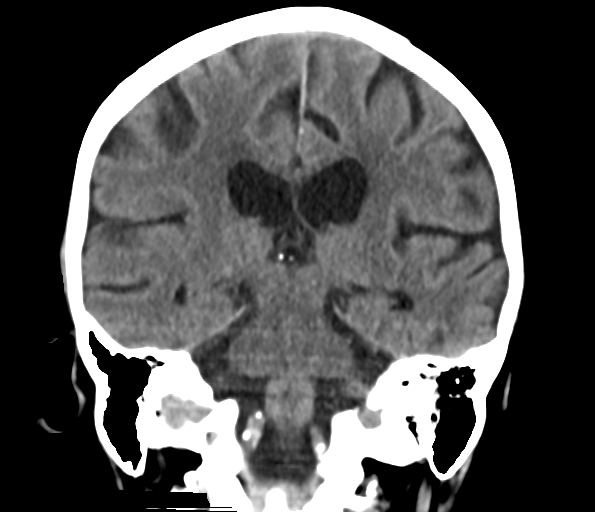
[im 22/39  brain]
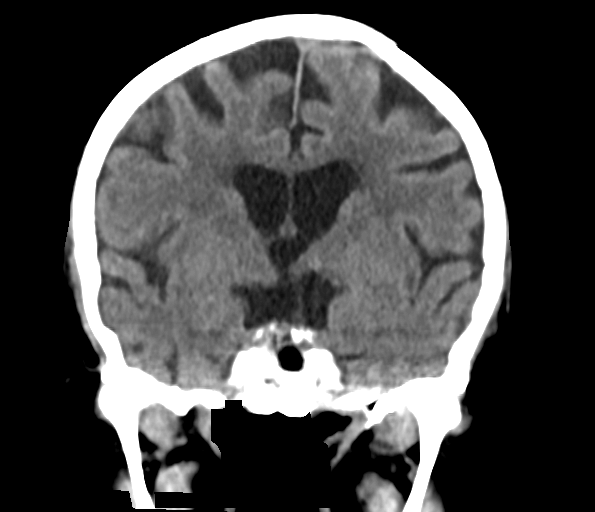

[Series 6: brain sag 5.00 hr40 s3 · sagittal · 0.31mm/px · 3 of 35 slices shown]
[im 12/35  brain]
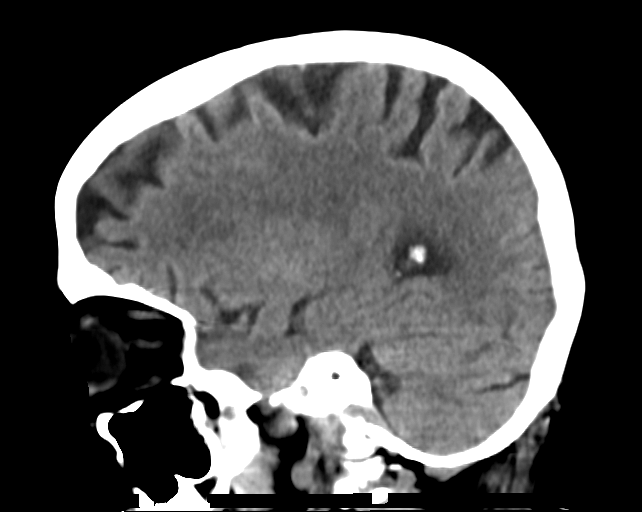
[im 18/35  brain]
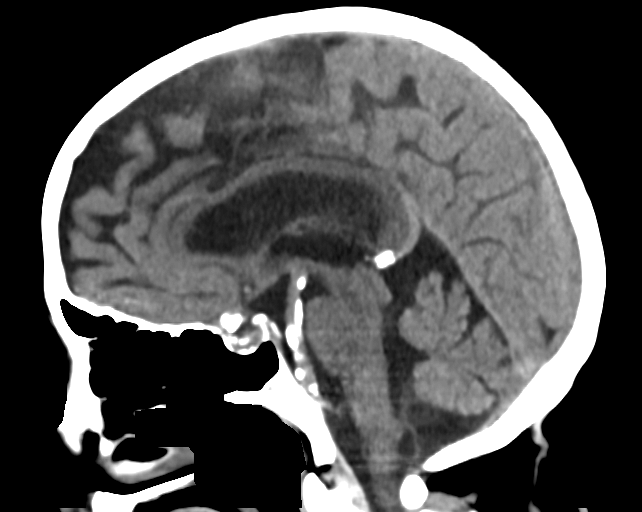
[im 23/35  brain]
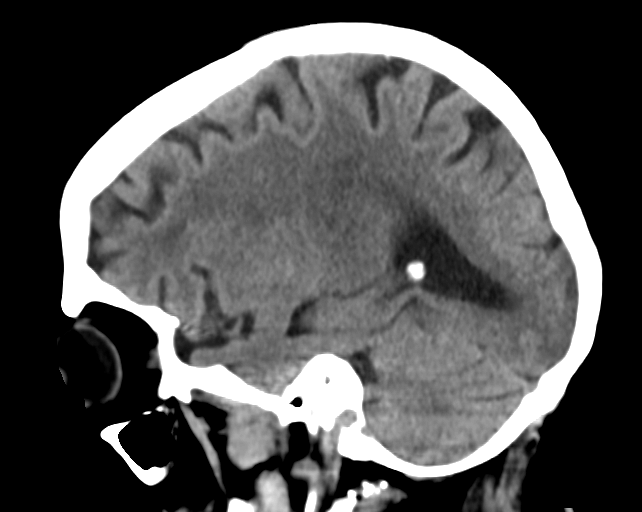

[Series 8: brain ax 2.00 hr60 s3 · axial · 0.36mm/px · z∈[-524,-398]mm · 8 of 79 slices shown, 10 images]
[im 8/79  brain]
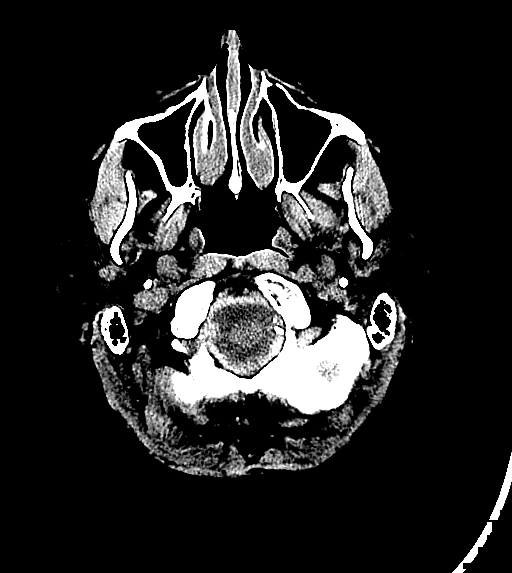
[im 8/79  bone]
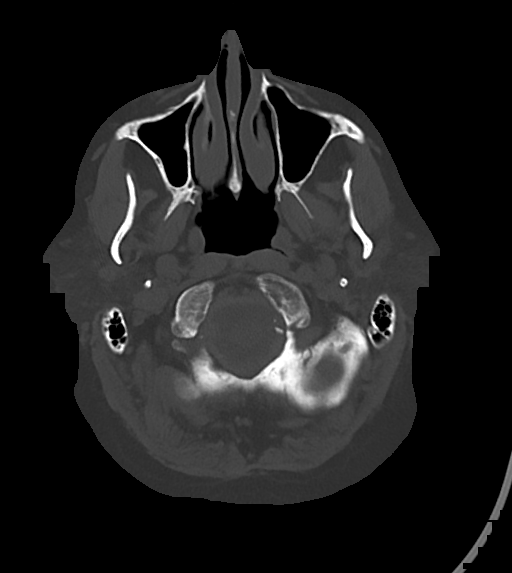
[im 15/79  brain]
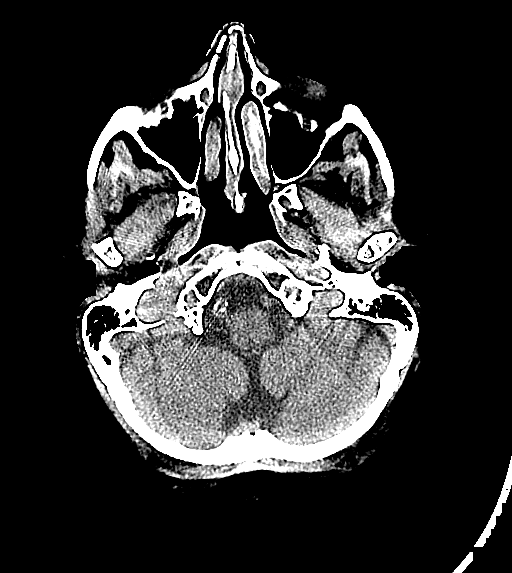
[im 27/79  brain]
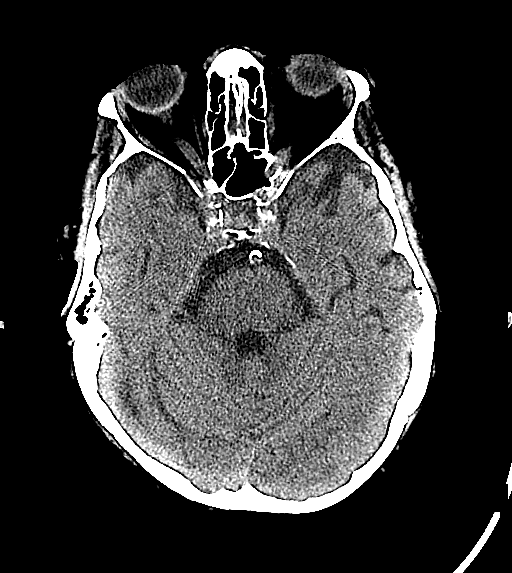
[im 34/79  brain]
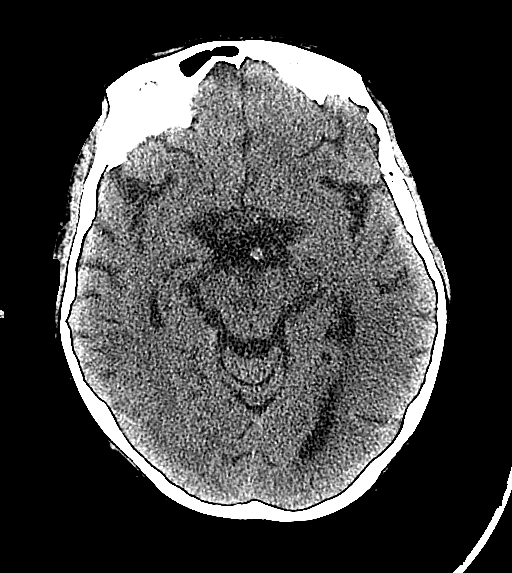
[im 45/79  brain]
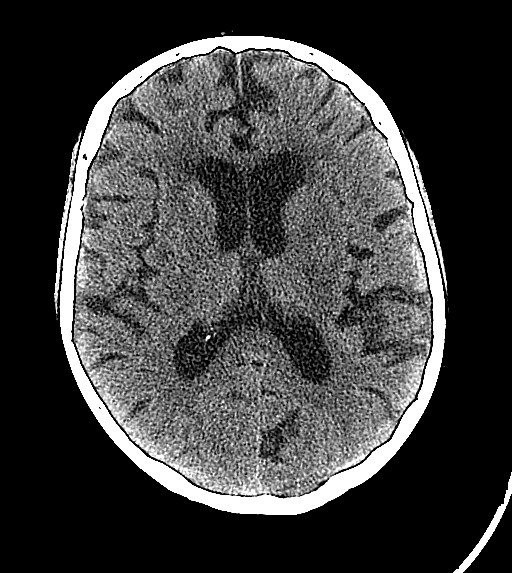
[im 45/79  bone]
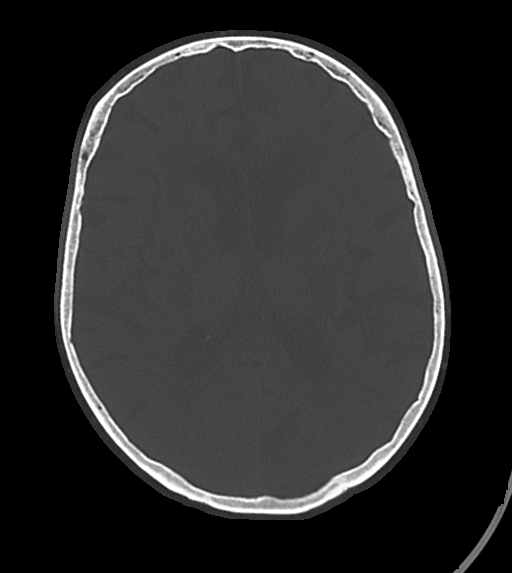
[im 53/79  brain]
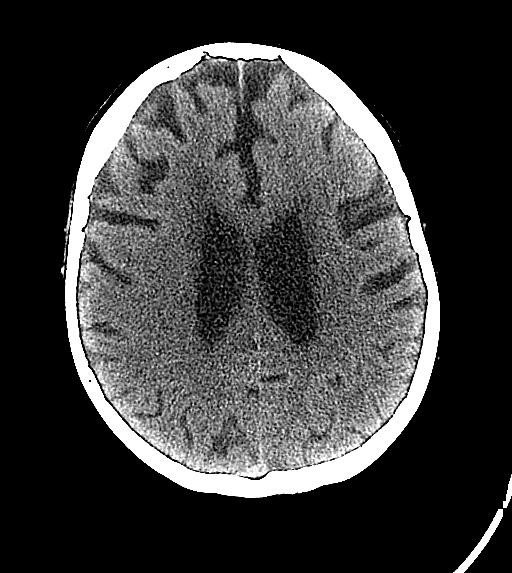
[im 64/79  brain]
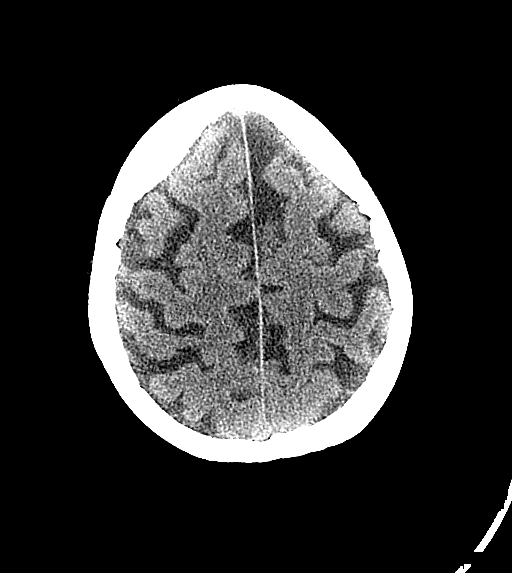
[im 71/79  brain]
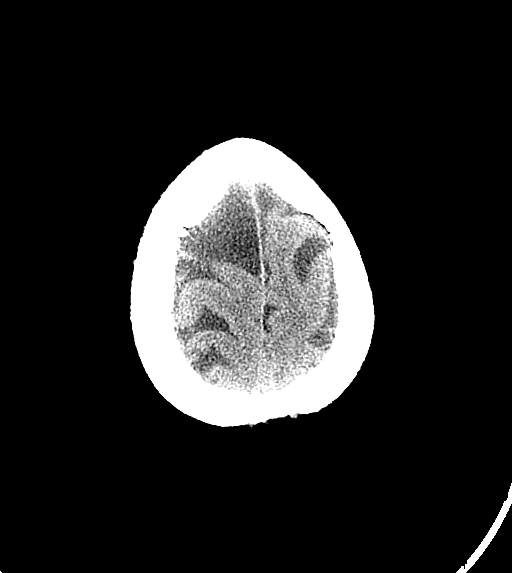

[14 of 47 positions shown; findings below may reference images not displayed]

DIAGNOSTIC STUDIES

EXAM

CT head without contrast

INDICATION

stroke symptoms
instability for the last few days, stroke symptoms. ct/nm 2/0

TECHNIQUE

All CT scans at this facility use dose modulation, iterative reconstruction, and/or weight based
dosing when appropriate to reduce radiation dose to as low as reasonably achievable.

Number of previous computed tomography exams in the last 12 months is 2.

Number of previous nuclear medicine myocardial perfusion studies in the last 12 months is 0 .

COMPARISONS

July 25, 2021

FINDINGS

Extensive cortical atrophy is seen with proportional enlargement of the ventricular system.
Calcification of the vertebral and cavernous carotid arteries is evident with probable high-grade st
enosis of the basilar artery. This is not completely evaluated. This is unchanged from prior exam.

Scattered small vessel ischemic changes are evident throughout the supratentorial white matter. No
intracranial hemorrhage or mass effect is seen.

Bony calvarium is normal.

IMPRESSION

Cortical atrophy and small vessel ischemic disease without change from prior exam. There is
extensive vascular calcification. No intracranial hemorrhage or mass effect is seen.

Tech Notes:

instability for the last few days, stroke symptoms. ct/nm 2/0

## 2021-11-25 NOTE — Progress Notes
71 yr old female  LKW 2230 last night  2am- up to bathroom, fell, unstable gait  Woke up with left UE/LE weakness, ataxia  NIH 4  CT head- no bleed, chronic changes  CTA- no LVO, 50% narrowing of left carotid bulb  Labs good  No facial droop or slurred speech  No stroke hx  No acute stroke intervention  Recommend asa/plavix x 21 days, high intensity statin, and regular stroke w/u/MRI  Can f/u with [facility] in clinic

## 2021-11-26 IMAGING — MR Head^Brain
10 series · 45 of 48 positions shown · non-contrast
Comparison: none

[Series 4: T1 · sagittal · 5.0mm · 0.45mm/px · 3 of 20 slices shown (1 of 2)]
[im 1/20]
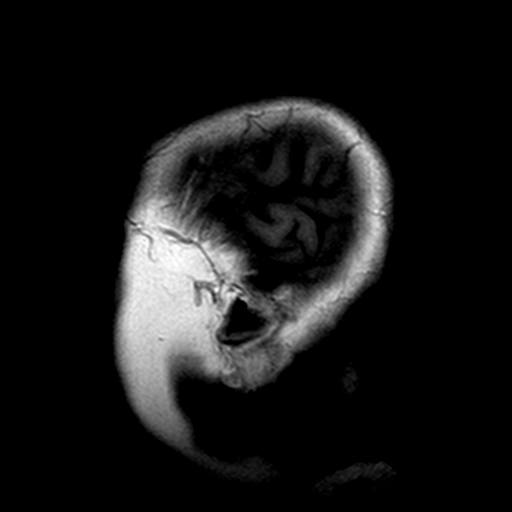
[im 10/20]
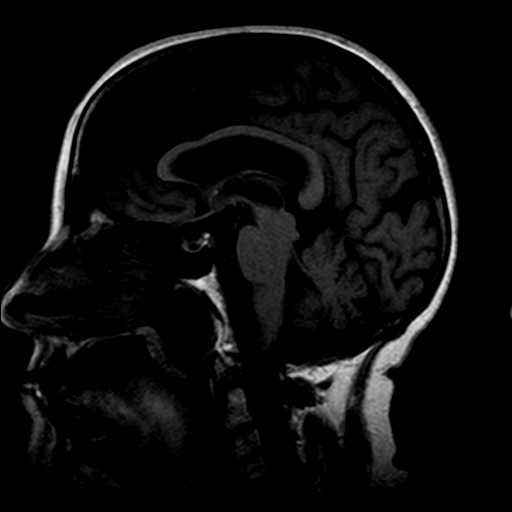
[im 20/20]
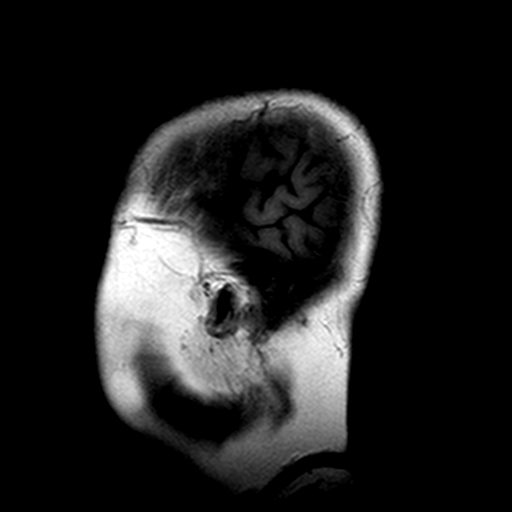

[Series 5: DWI · axial · 5.0mm · 1.80mm/px · z∈[-69,+61]mm · 11 of 63 slices shown (1 of 2)]
[im 1/63]
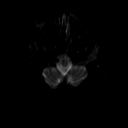
[im 7/63]
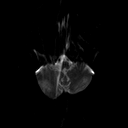
[im 13/63]
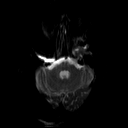
[im 19/63]
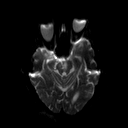
[im 25/63]
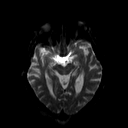
[im 32/63]
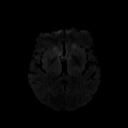
[im 38/63]
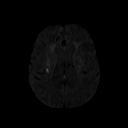
[im 44/63]
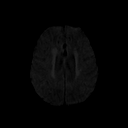
[im 50/63]
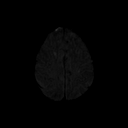
[im 56/63]
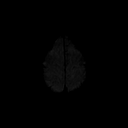
[im 63/63]
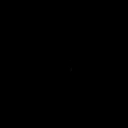

[Series 6: DWI · axial · 5.0mm · 1.80mm/px · z∈[-69,+61]mm · 4 of 21 slices shown (2 of 2)]
[im 1/21]
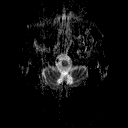
[im 7/21]
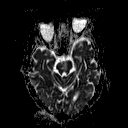
[im 14/21]
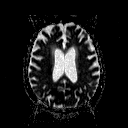
[im 21/21]
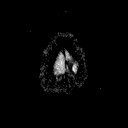

[Series 10: FLAIR · axial · 5.0mm · 0.45mm/px · z∈[-86,+62]mm · 4 of 24 slices shown]
[im 1/24]
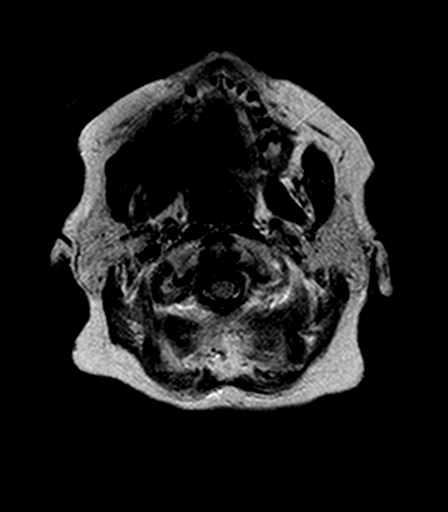
[im 8/24]
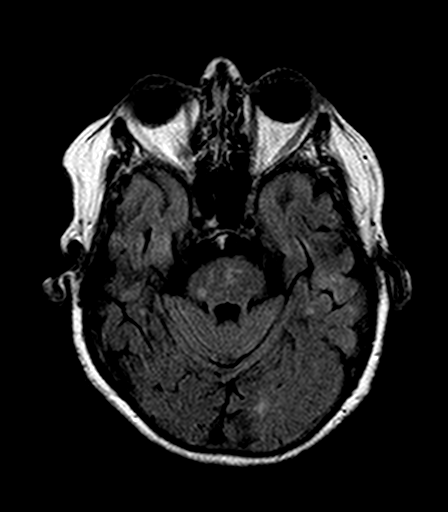
[im 16/24]
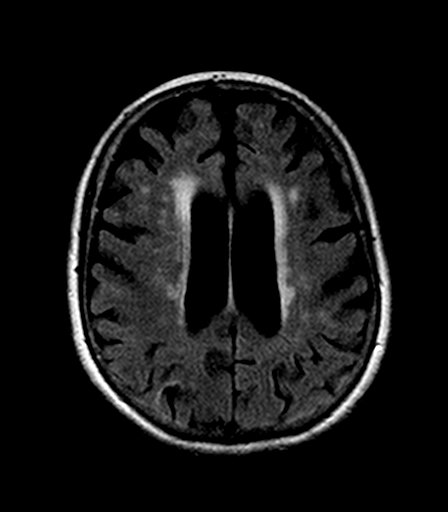
[im 24/24]
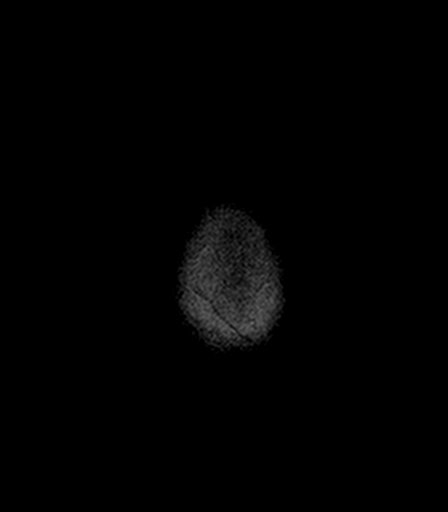

[Series 11: T2 · axial · 5.0mm · 0.72mm/px · z∈[-86,+62]mm · 4 of 24 slices shown (1 of 2)]
[im 1/24]
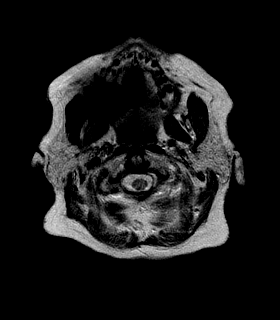
[im 8/24]
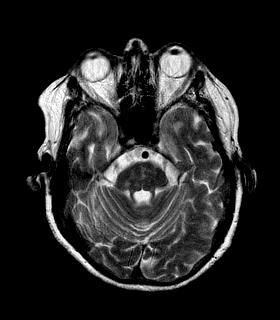
[im 16/24]
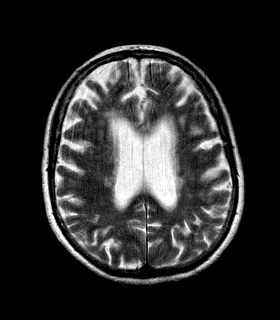
[im 24/24]
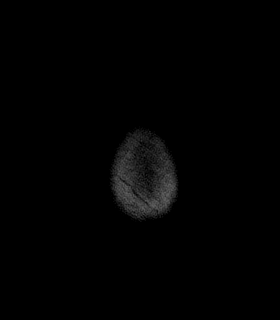

[Series 12: T1 · axial · 5.0mm · 0.45mm/px · z∈[-86,+62]mm · 4 of 24 slices shown (2 of 2)]
[im 1/24]
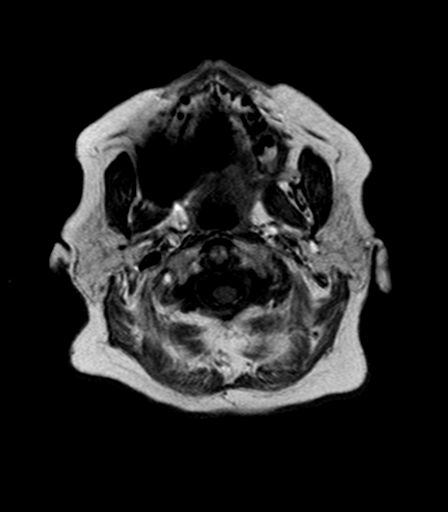
[im 8/24]
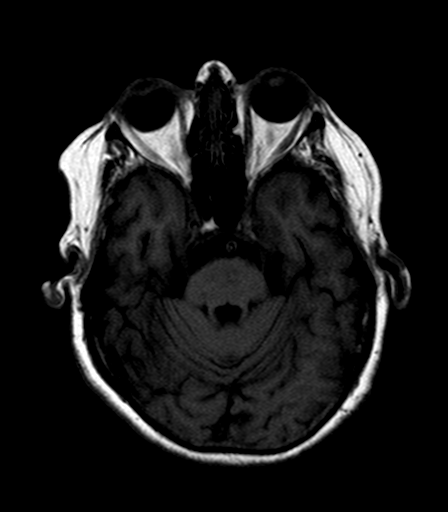
[im 16/24]
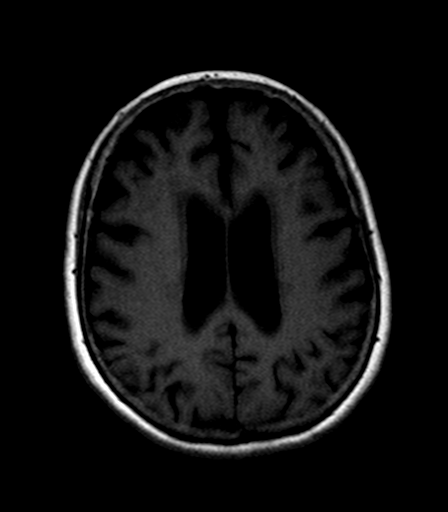
[im 24/24]
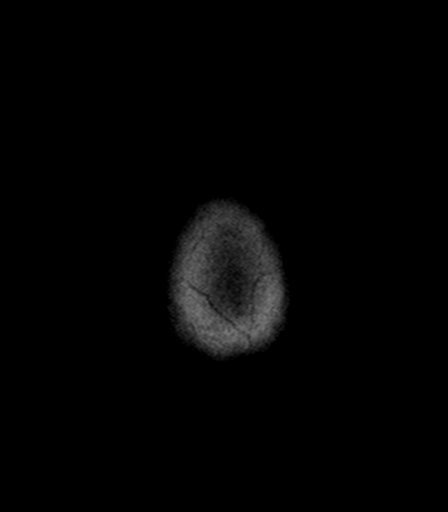

[Series 14: axial blood · axial · 5.0mm · 0.45mm/px · 1 of 24 slices shown]
[im 1/24]
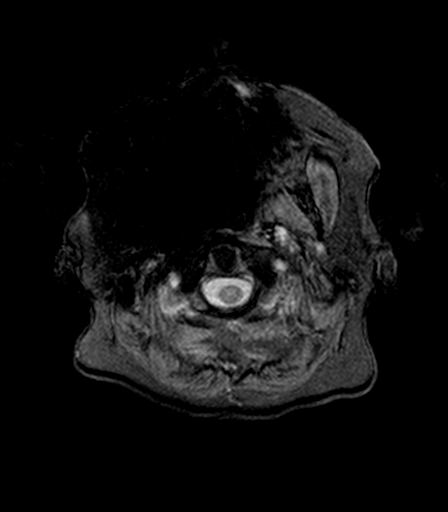

[Series 15: T2 · coronal · 5.0mm · 0.69mm/px · 5 of 26 slices shown (2 of 2)]
[im 1/26]
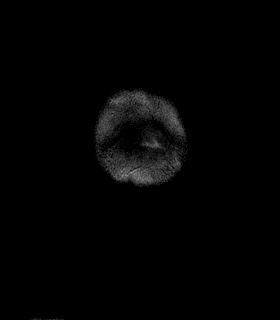
[im 7/26]
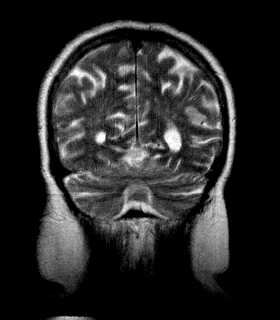
[im 13/26]
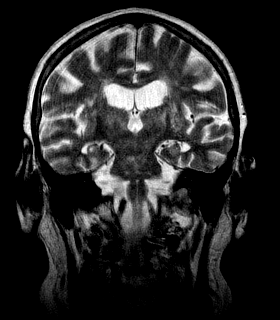
[im 19/26]
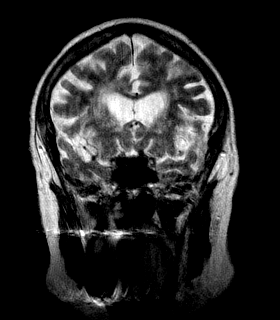
[im 26/26]
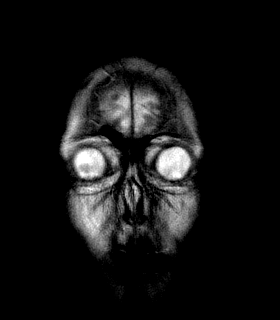

[Series 19: T1 fat-sat post-contrast · axial · 5.0mm · 0.90mm/px · z∈[-86,+62]mm · 4 of 24 slices shown (1 of 2)]
[im 1/24]
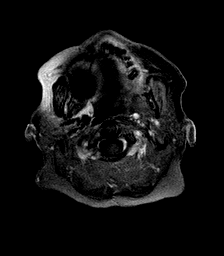
[im 8/24]
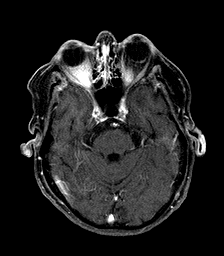
[im 16/24]
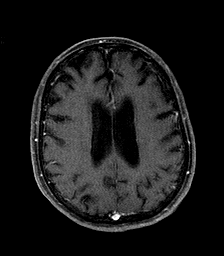
[im 24/24]
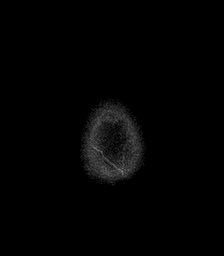

[Series 20: T1 fat-sat post-contrast · coronal · 5.0mm · 0.90mm/px · 5 of 26 slices shown (2 of 2)]
[im 1/26]
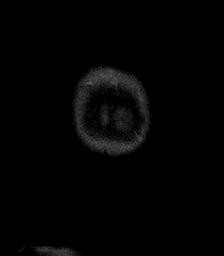
[im 7/26]
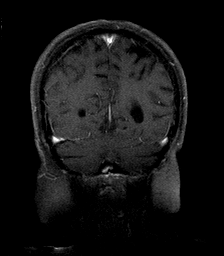
[im 13/26]
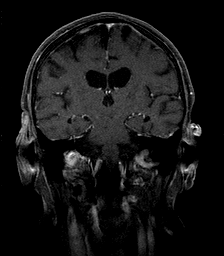
[im 19/26]
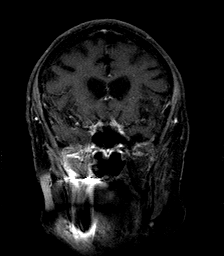
[im 26/26]
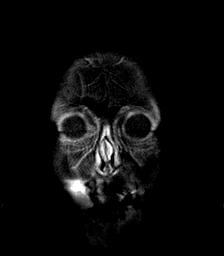

[45 of 48 positions shown; findings below may reference images not displayed]

EXAM

MAGNETIC RESONANCE IMAGING, BRAIN (INCLUDING BRAIN STEM) WITH CONTRAST, MRI OF THE CIRCLE OF WILLIS

INDICATION

PATIENT PRESENTED YESTERDAY WITH WEAKNESS TO LEFT SIDE, UNABLE TO USE ARM OR LEG.  ACUTE ONSET,
FELL TO THE FLOOR, UNABLE TO GET UP.  IMPROVEMENT IN SYMPTOMS.  NO SLURRED SPEECH.  15 ML GADAVIST.
RG

TECHNIQUE

Multiplanar multiecho sequences were generated through the brain utilizing a 1.5 Tesla magnet. 15
mL of Gadavist were was administered intravenously. 3D time-of-flight imaging of the circle of
Willis was performed as well.

COMPARISONS

Reference is made to CT scan of the brain dated 11/25/2021.

FINDINGS

There is evidence of restricted diffusion along the right corona radiata consistent with an
acute/subacute cerebrovascular accident.

Cortical and periventricular T2 and FLAIR white matter signal abnormalities are suggestive of
chronic ischemic demyelinating changes. Similar changes are seen along the brainstem.

There is a moderate degree of cortical atrophy. The ventricles and sulci are accordingly prominent.

Normal flow voids are seen in the major vessels of the circle of Willis. There are no retro-orbital
masses.  There are no cerebellopontine angle masses.

The paranasal sinuses are clear. The mastoid air cells are adequately aerated as well.

Sagittal images demonstrate mild atrophy of the corpus callosum. The optic chiasm, pituitary,
clivus and quadrigeminal plate appear within normal limits. There is no evidence of herniation of
the cerebellar tonsils.

Normal fatty marrow signal is identified in the skull base and overlying calvarium.  There is no
evidence of T1 shortening.  There are no abnormal enhancing masses following the infusion of
gadolinium.

The anterior cerebral arteries, anterior communicating and middle cerebral arteries appear
unremarkable.

The right vertebral artery is dominant with a small left vertebral artery. The posterior cerebral,
superior cerebellar and posterior inferior cerebellar arteries are patent. The anterior inferior
cerebellar arteries are poorly visualized.

IMPRESSION

1. There is restricted diffusion along the right periventricular white matter at the level of the
coronal radiata consistent with an acute/subacute cerebral vascular accident.

2. Chronic ischemic demyelinating changes are seen in the periventricular white matter and
brainstem. There is a moderate degree of cortical atrophy.

3. The right vertebral artery is dominant. The left vertebral artery is small. The anterior
inferior cerebellar arteries are poorly visualized.

Tech Notes:

PT PRESENTED YESTERDAY WITH WEAKNESS TO LEFT SIDE, UNABLE TO USE ARM OR LEG.  ACUTE ONSET, FELL TO
THE FLOOR, UNABLE TO GET UP.  IMPROVEMENT IN SYMPTOMS.  NO SLURRED SPEECH.  15 ML GADAVIST.  RG

## 2021-11-26 IMAGING — US ECHOCOMPL
1 series · 12 of 24 positions shown · non-contrast
Comparison: none

PROCEDURES:
Echocardiographic Report:
Transthoracic echocardiogram with complete 2D, M-Mode, pulsed continuous wave and color Doppler were
utilized to image cardiac structures in accordance with the standards of the American College of
Cardiology and the American Society of Echocardiography.
-
INDICATIONS:
Indication(s): stroke.
Measurements:

2D/M Mode Doppler
Measurement Value Normal Range Measurement Value Normal Range
IVSd 2D 1.37 [ 0.60 - 0.90 ] cm AV Peak Vel 236.00 [ 100.00 - 170.00 ] cm/sec
LVIDd 2D 2.85 [ 3.78 - 5.22 ] cm AV VTI 33.80 cm
LVIDs 2D 2.10 [ 2.16 - 3.48 ] cm AV Mean Vel 138.00 [ 70.00 - 90.00 ] cm/sec
LVPWd 2D 1.35 [ 0.60 - 0.90 ] cm AV Mean PG 9.00 [ 2.00 - 4.00 ] mmHg
LA Diam 2D 3.70 cm AV Peak PG 22.00 [ 2.00 - 9.00 ] mmHg
AoR Diam 2D 2.90 [ 2.30 - 3.10 ] cm AVA VTI 1.80 [ 2.00 - 4.00 ] cm2
EF Mod 2C 78 [ 52 - 76 ] % LVOT Diam 2.00 [ 2.10 - 2.50 ] cm
EF Mod 4C 78 [ 46 - 78 ] % LVOT Mean Vel 67.00 [ 60.00 - 80.00 ] cm/sec
EF Mod BP 77 [ 54 - 74 ] % LVOT Mean PG 3.00 [ 1.00 - 3.00 ] mmHg
LA Volume 2C 37.80 [ 22.00 - 52.00 ] ML LVOT Peak Vel 107.00 [ 70.00 - 110.00 ] cm/sec
LA Volume 4C 36.70 [ 22.00 - 52.00 ] Ml LVOT Peak PG 5.00 [ 2.00 - 6.00 ] mmHg
LA Volume Index 23 [ 16 - 34 ] mL/m2 LVOT VTI 19.40 [ 20.00 - 30.00 ] cm
TAPSE 1.78 [ 1.70 - 3.00 ] cm MV E Peak Vel 63.40 [ 60.00 - 130.00 ] cm/sec
MV A Peak Vel 117.00 [ 100.00 - 120.00 ] cm/sec
MV Decel Time 164.00 [ 104.00 - 258.00 ] msec
Lat E` Vel 5.51 [ 10.00 - 15.00 ] cm/sec
Lateral E/E` 11.50 [ 1.00 - 2.00 ] ratio

[Series 1: us echo 2d, complete · 98 acquisitions, 12 frames shown]
[im 5/98]
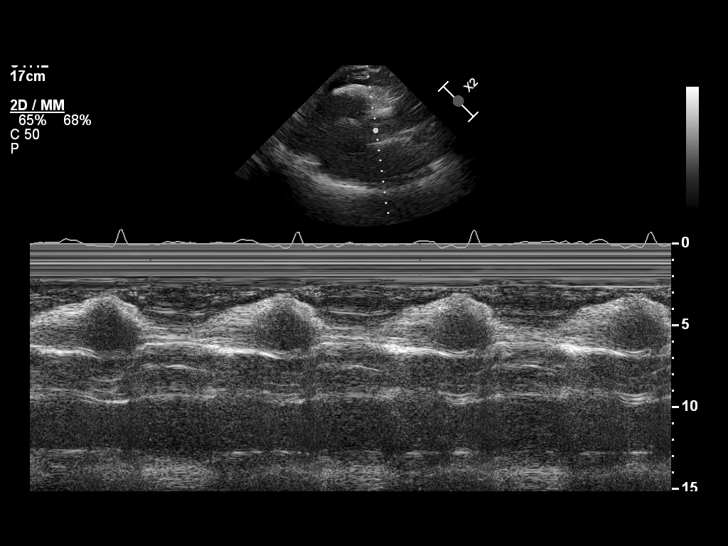
[im 13/98]
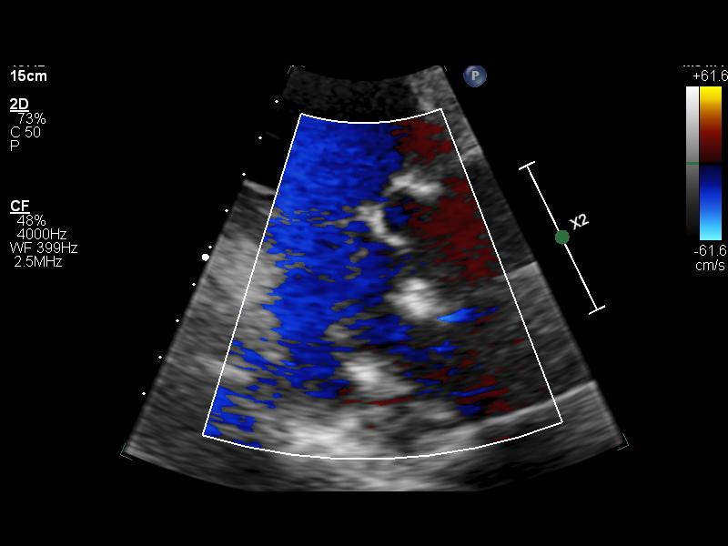
[im 22/98]
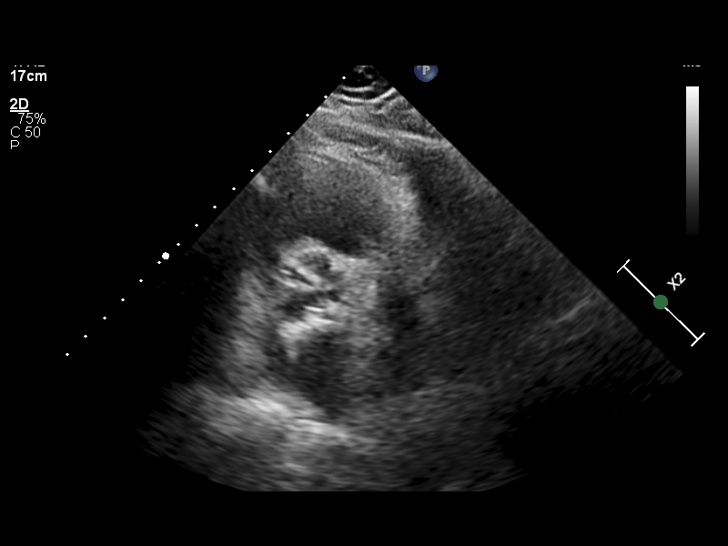
[im 30/98]
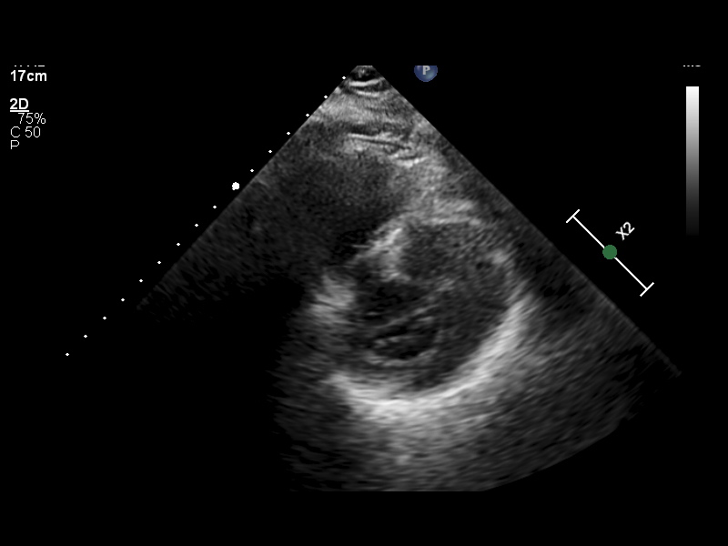
[im 38/98]
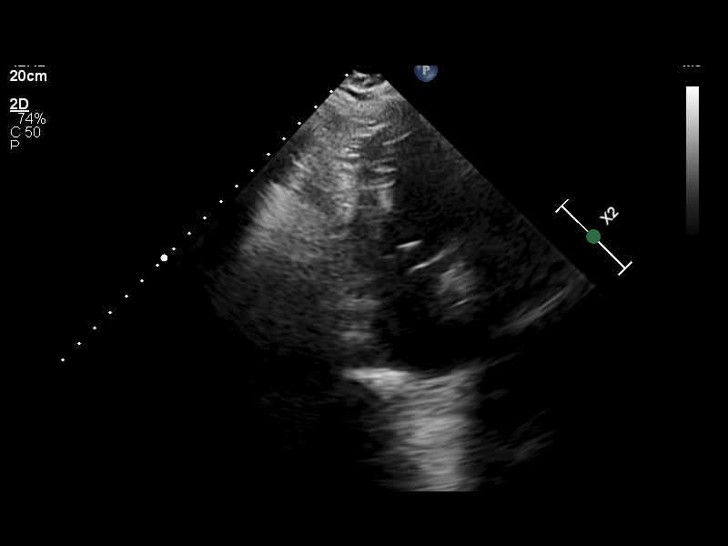
[im 47/98]
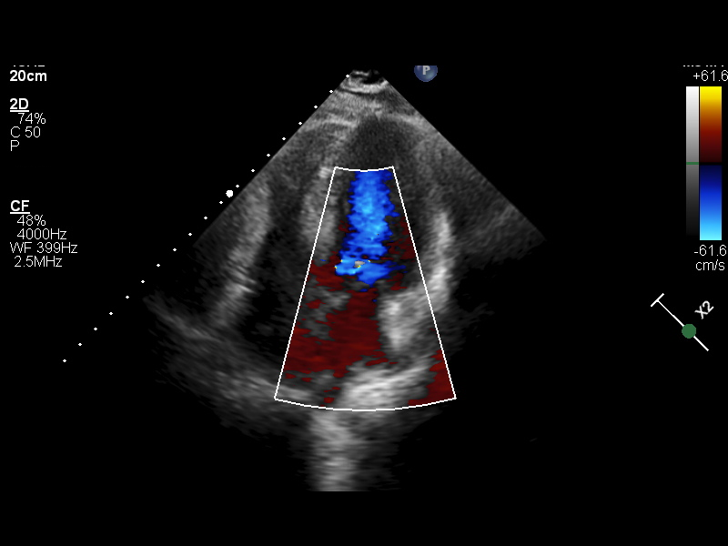
[im 55/98]
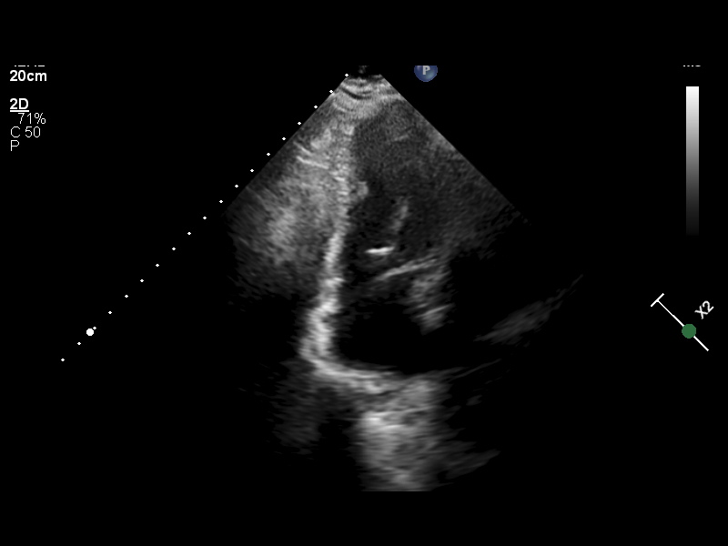
[im 64/98]
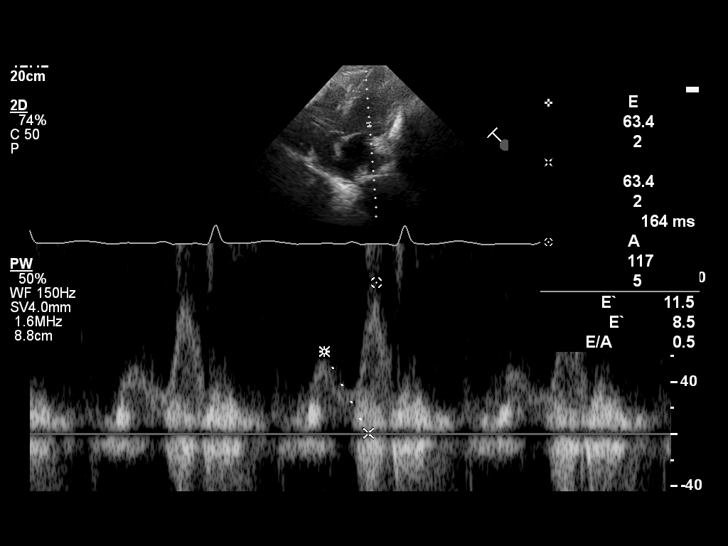
[im 76/98]
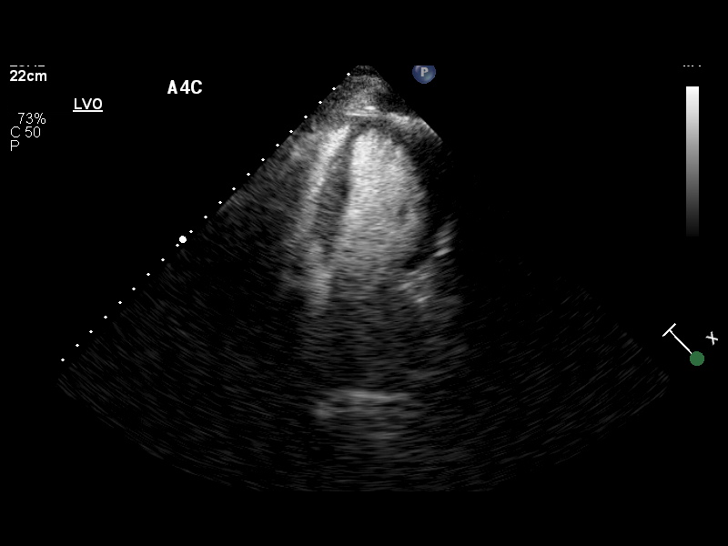
[im 81/98]
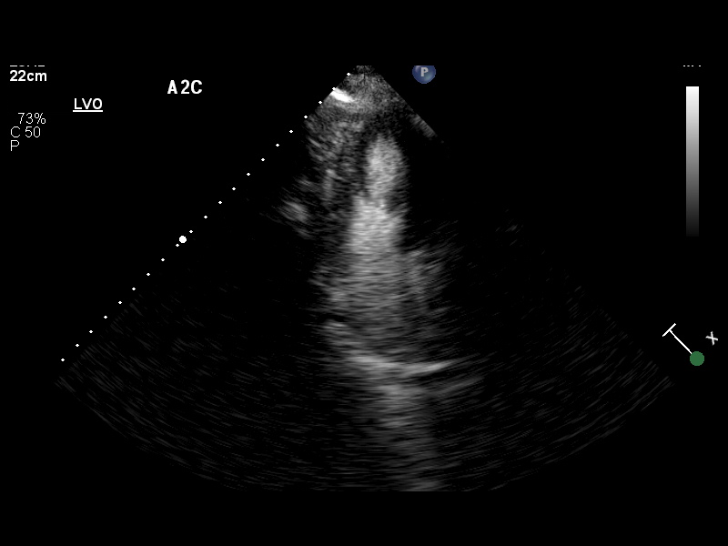
[im 89/98]
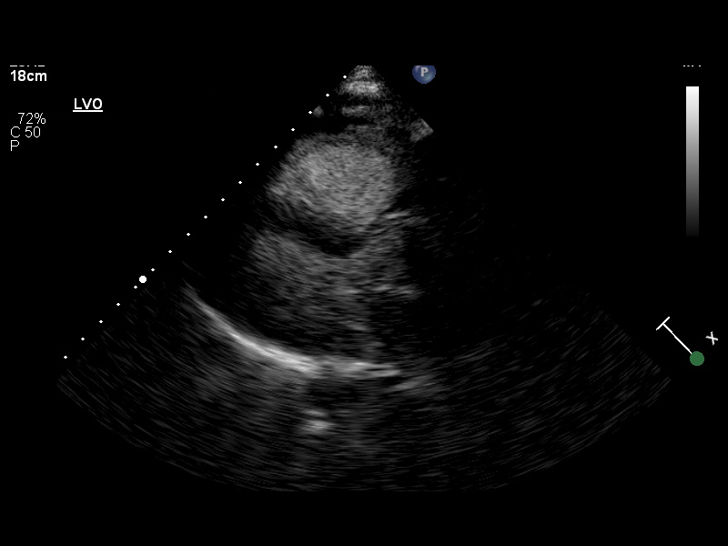
[im 98/98]
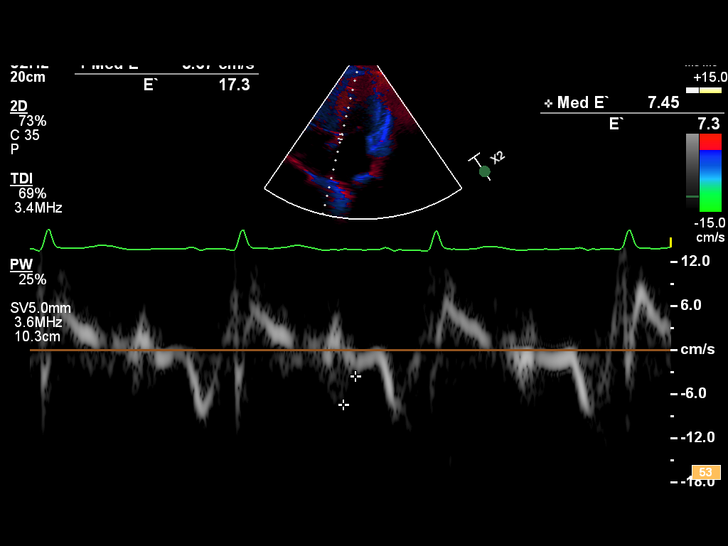

[12 of 24 positions shown; findings below may reference images not displayed]

FINDINGS: Study Details:
Technically difficult study due to limited acoustic windows. Definity contrast was used for left
ventricular opacification and endocardial border enhancement.
Left Ventricle:
The left ventricle was small. There is hyperdynamic left ventricular systolic function. LV Ejection
Fraction was 77 %. The left ventricular ejection fraction was calculated using the biplane Klpigbb`Descartes
rule method. Mild left ventricular hypertrophy. Left ventricular diastolic parameters were normal.
Right Ventricle:
Normal right ventricular size. Not well visualized.
Left Atrium:
The left atrium is normal in size.
Right Atrium:
The right atrium is normal in size.
Atrial Septum:
The interatrial septum is normal in appearance. Agitated saline bubble study was negative for
intracardiac shunt.
Mitral Valve:
The mitral valve demonstrates normal leaflet morphology.
Aortic Valve:
Structurally normal, trileaflet aortic valve. Aortic cusps appear mildly calcified. The peak
transaortic velocity was 236 cm/sec. The mean transaortic gradient was 9 mmHg. The aortic valve area
by the continuity equation (using VTI) was 1.8 cm2. The aortic valve area indexed to the BSA is
Tricuspid Valve:
Normal appearance of the tricuspid valve. Tricuspid valve not well visualized.
Pulmonic Valve:
Pulmonic valve not well visualized.
Pericardium:
No significant pericardial effusion.
Aorta:
Sinus of Valsalva: 2.9 cm. Normal range for aortic root on the basis of BSA is 2.4 cm. to
cm. Ascending Aorta 3.3 cm.
IVC:
The IVC diameter was 12 mm. The inferior vena cava shows a normal respiratory collapse consistent
with normal right atrial pressure (3 mmHg).
Pulmonary Artery:
Not well visualized.
Pulmonary Veins:
Pulse Doppler interrogation shows normal systolic predominant flow.
-
CONCLUSIONS:
1. The left ventricle was small. There is hyperdynamic left ventricular systolic function. LV
Ejection Fraction was 77 %. The left ventricular ejection fraction was calculated using the biplane
Klpigbb`Descartes rule method. Mild left ventricular hypertrophy. Left ventricular diastolic parameters were
normal.

Tech Notes:

DEFINITY GIVEN LOT #[id number]

## 2021-11-26 IMAGING — MR Head^Brain
1 series · 48 of 48 positions shown · non-contrast
Comparison: none

[Series 4: cow · axial · 0.8mm · 0.56mm/px · z∈[-68,+3]mm · 48 of 90 slices shown]
[im 1/90]
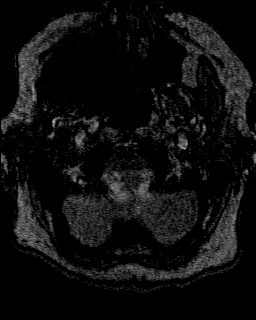
[im 2/90]
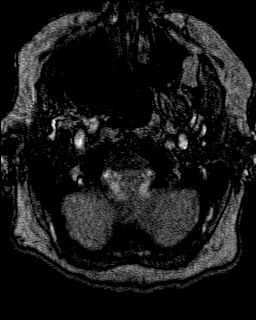
[im 4/90]
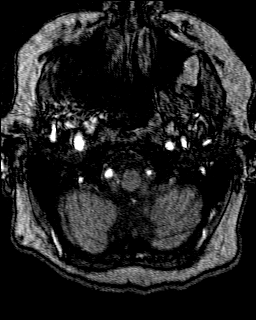
[im 6/90]
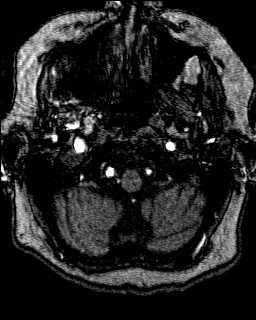
[im 8/90]
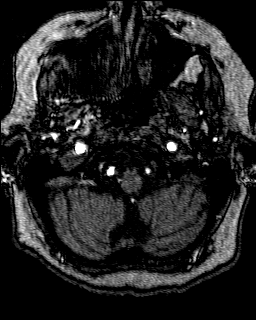
[im 10/90]
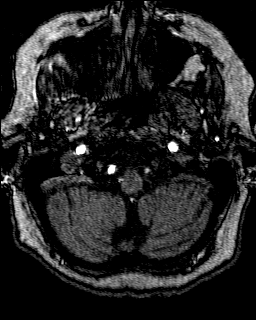
[im 12/90]
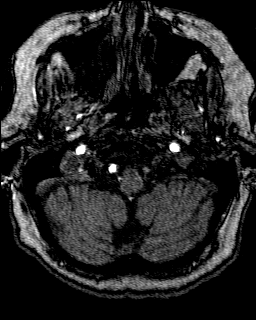
[im 14/90]
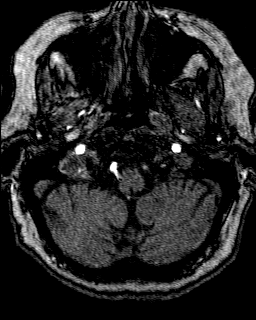
[im 16/90]
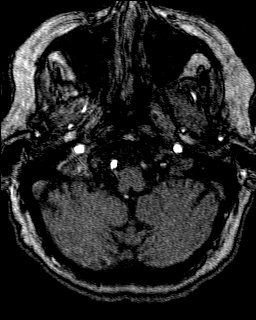
[im 18/90]
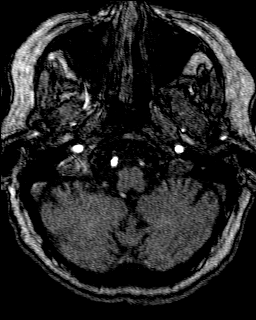
[im 19/90]
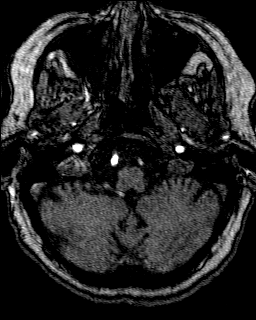
[im 21/90]
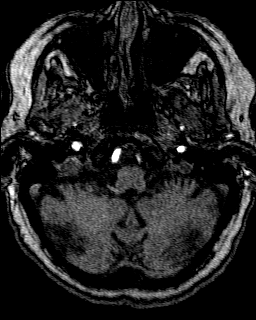
[im 23/90]
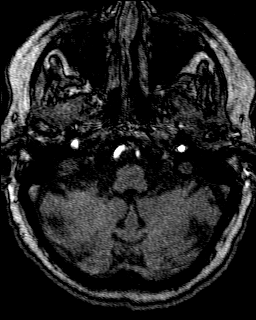
[im 25/90]
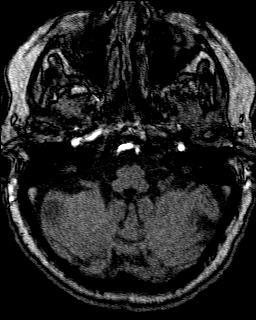
[im 27/90]
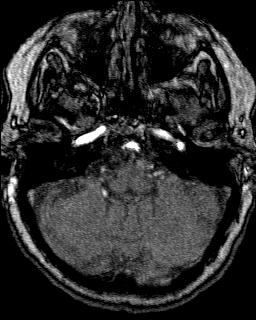
[im 29/90]
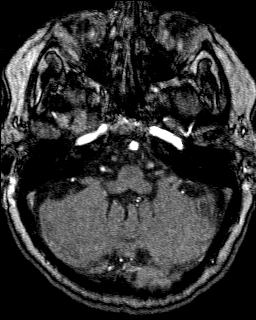
[im 31/90]
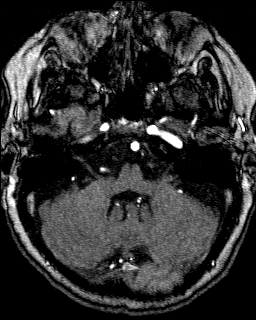
[im 33/90]
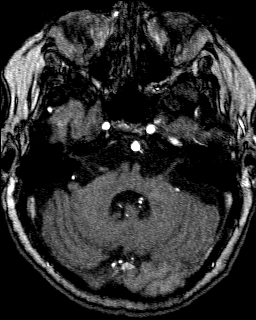
[im 35/90]
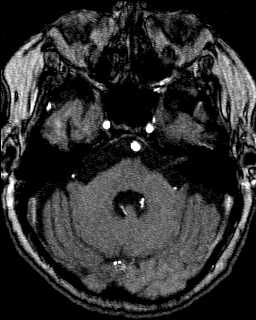
[im 36/90]
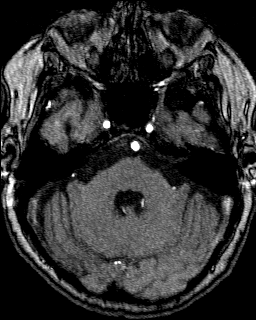
[im 38/90]
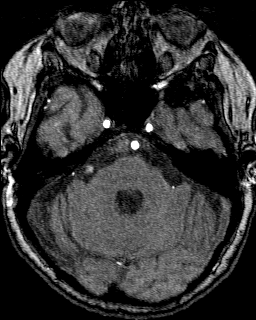
[im 40/90]
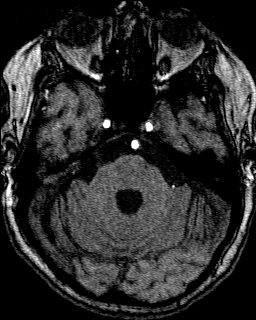
[im 42/90]
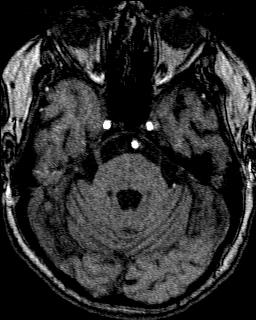
[im 44/90]
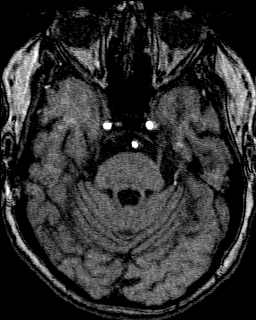
[im 46/90]
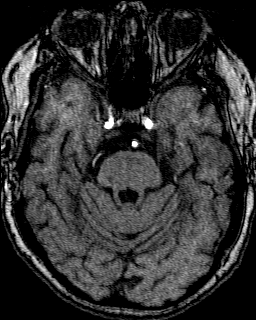
[im 48/90]
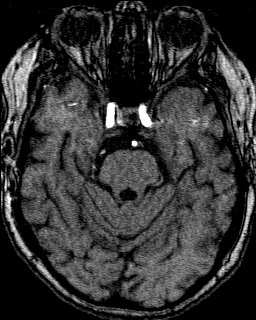
[im 50/90]
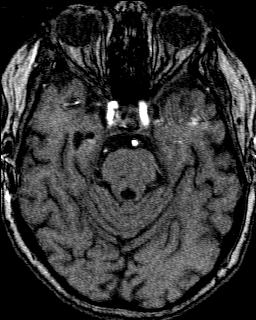
[im 52/90]
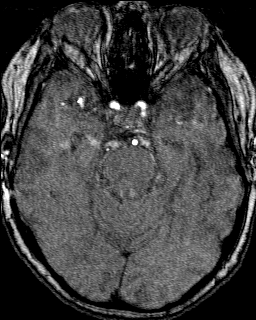
[im 54/90]
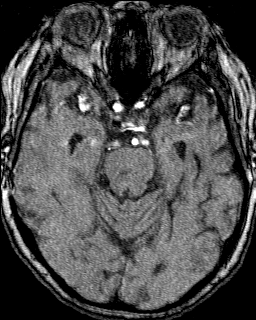
[im 55/90]
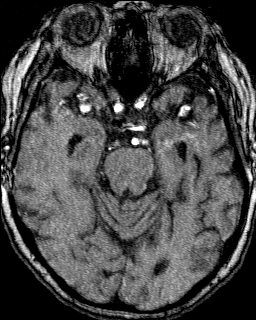
[im 57/90]
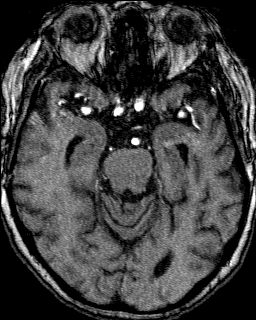
[im 59/90]
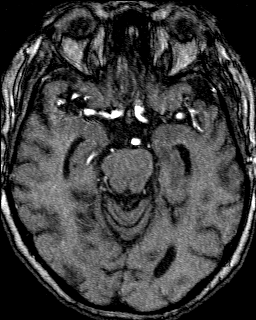
[im 61/90]
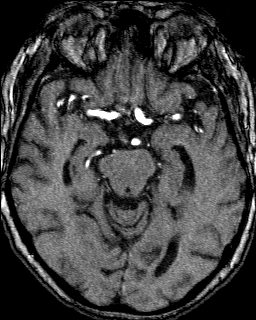
[im 63/90]
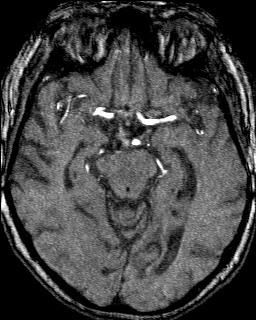
[im 65/90]
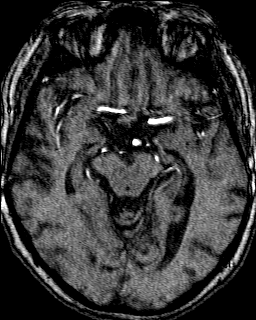
[im 67/90]
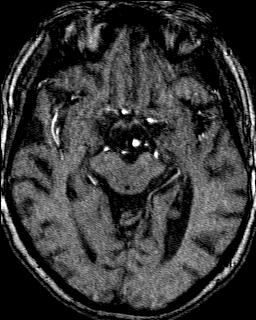
[im 69/90]
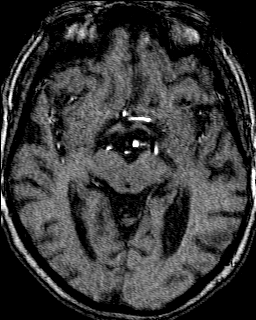
[im 71/90]
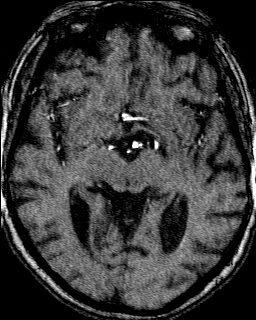
[im 72/90]
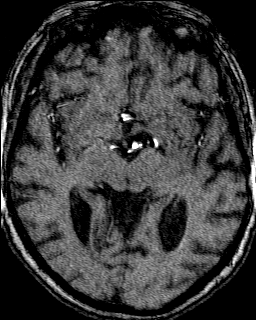
[im 74/90]
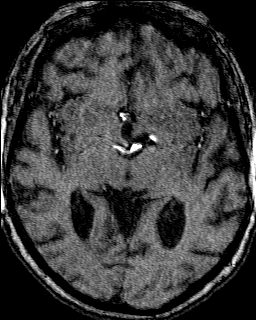
[im 76/90]
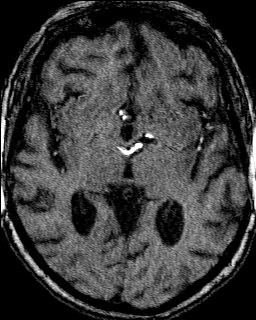
[im 78/90]
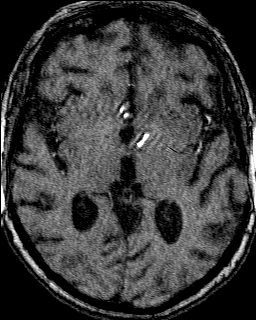
[im 80/90]
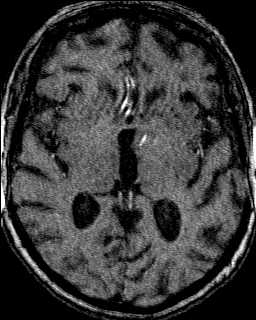
[im 82/90]
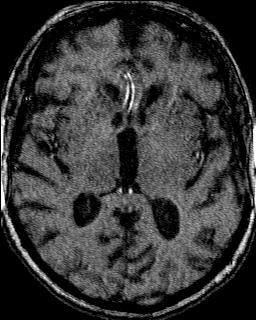
[im 84/90]
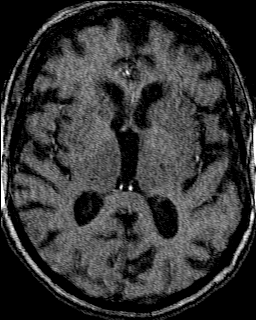
[im 86/90]
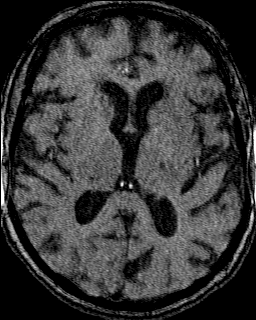
[im 88/90]
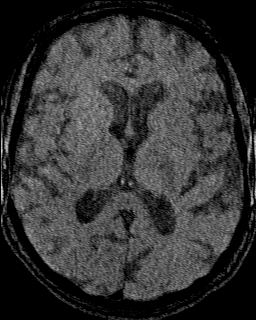
[im 90/90]
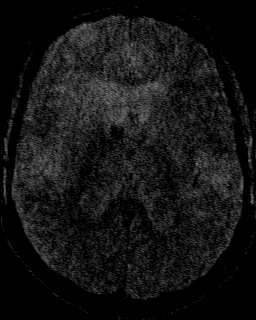

[48 of 48 positions shown; findings below may reference images not displayed]

EXAM

MAGNETIC RESONANCE IMAGING, BRAIN (INCLUDING BRAIN STEM) WITH CONTRAST, MRI OF THE CIRCLE OF WILLIS

INDICATION

PATIENT PRESENTED YESTERDAY WITH WEAKNESS TO LEFT SIDE, UNABLE TO USE ARM OR LEG.  ACUTE ONSET,
FELL TO THE FLOOR, UNABLE TO GET UP.  IMPROVEMENT IN SYMPTOMS.  NO SLURRED SPEECH.  15 ML GADAVIST.
RG

TECHNIQUE

Multiplanar multiecho sequences were generated through the brain utilizing a 1.5 Tesla magnet. 15
mL of Gadavist were was administered intravenously. 3D time-of-flight imaging of the circle of
Willis was performed as well.

COMPARISONS

Reference is made to CT scan of the brain dated 11/25/2021.

FINDINGS

There is evidence of restricted diffusion along the right corona radiata consistent with an
acute/subacute cerebrovascular accident.

Cortical and periventricular T2 and FLAIR white matter signal abnormalities are suggestive of
chronic ischemic demyelinating changes. Similar changes are seen along the brainstem.

There is a moderate degree of cortical atrophy. The ventricles and sulci are accordingly prominent.

Normal flow voids are seen in the major vessels of the circle of Willis. There are no retro-orbital
masses.  There are no cerebellopontine angle masses.

The paranasal sinuses are clear. The mastoid air cells are adequately aerated as well.

Sagittal images demonstrate mild atrophy of the corpus callosum. The optic chiasm, pituitary,
clivus and quadrigeminal plate appear within normal limits. There is no evidence of herniation of
the cerebellar tonsils.

Normal fatty marrow signal is identified in the skull base and overlying calvarium.  There is no
evidence of T1 shortening.  There are no abnormal enhancing masses following the infusion of
gadolinium.

The anterior cerebral arteries, anterior communicating and middle cerebral arteries appear
unremarkable.

The right vertebral artery is dominant with a small left vertebral artery. The posterior cerebral,
superior cerebellar and posterior inferior cerebellar arteries are patent. The anterior inferior
cerebellar arteries are poorly visualized.

IMPRESSION

1. There is restricted diffusion along the right periventricular white matter at the level of the
coronal radiata consistent with an acute/subacute cerebral vascular accident.

2. Chronic ischemic demyelinating changes are seen in the periventricular white matter and
brainstem. There is a moderate degree of cortical atrophy.

3. The right vertebral artery is dominant. The left vertebral artery is small. The anterior
inferior cerebellar arteries are poorly visualized.

Tech Notes:

PT PRESENTED YESTERDAY WITH WEAKNESS TO LEFT SIDE, UNABLE TO USE ARM OR LEG.  ACUTE ONSET, FELL TO
THE FLOOR, UNABLE TO GET UP.  IMPROVEMENT IN SYMPTOMS.  NO SLURRED SPEECH.  15 ML GADAVIST.  RG

## 2021-11-27 IMAGING — CR SHOULDCMRT
3 series · 3 of 3 positions shown · non-contrast
Comparison: none

[x shoulder external right]
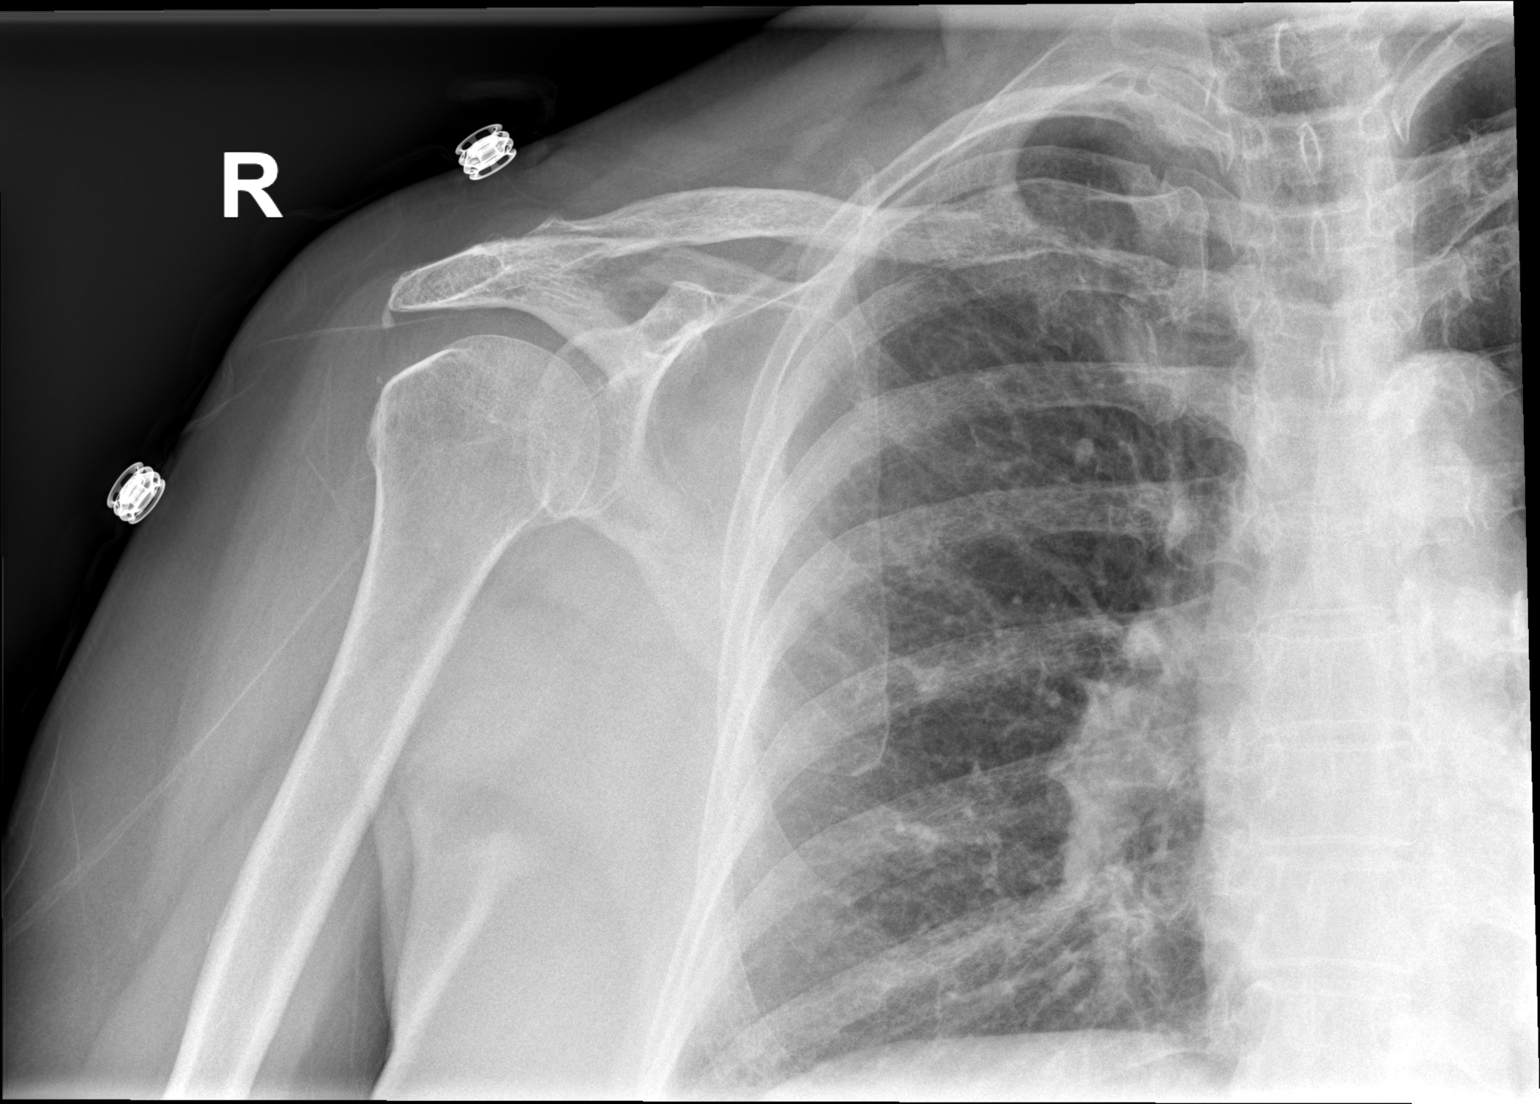

[x shoulder internal right]
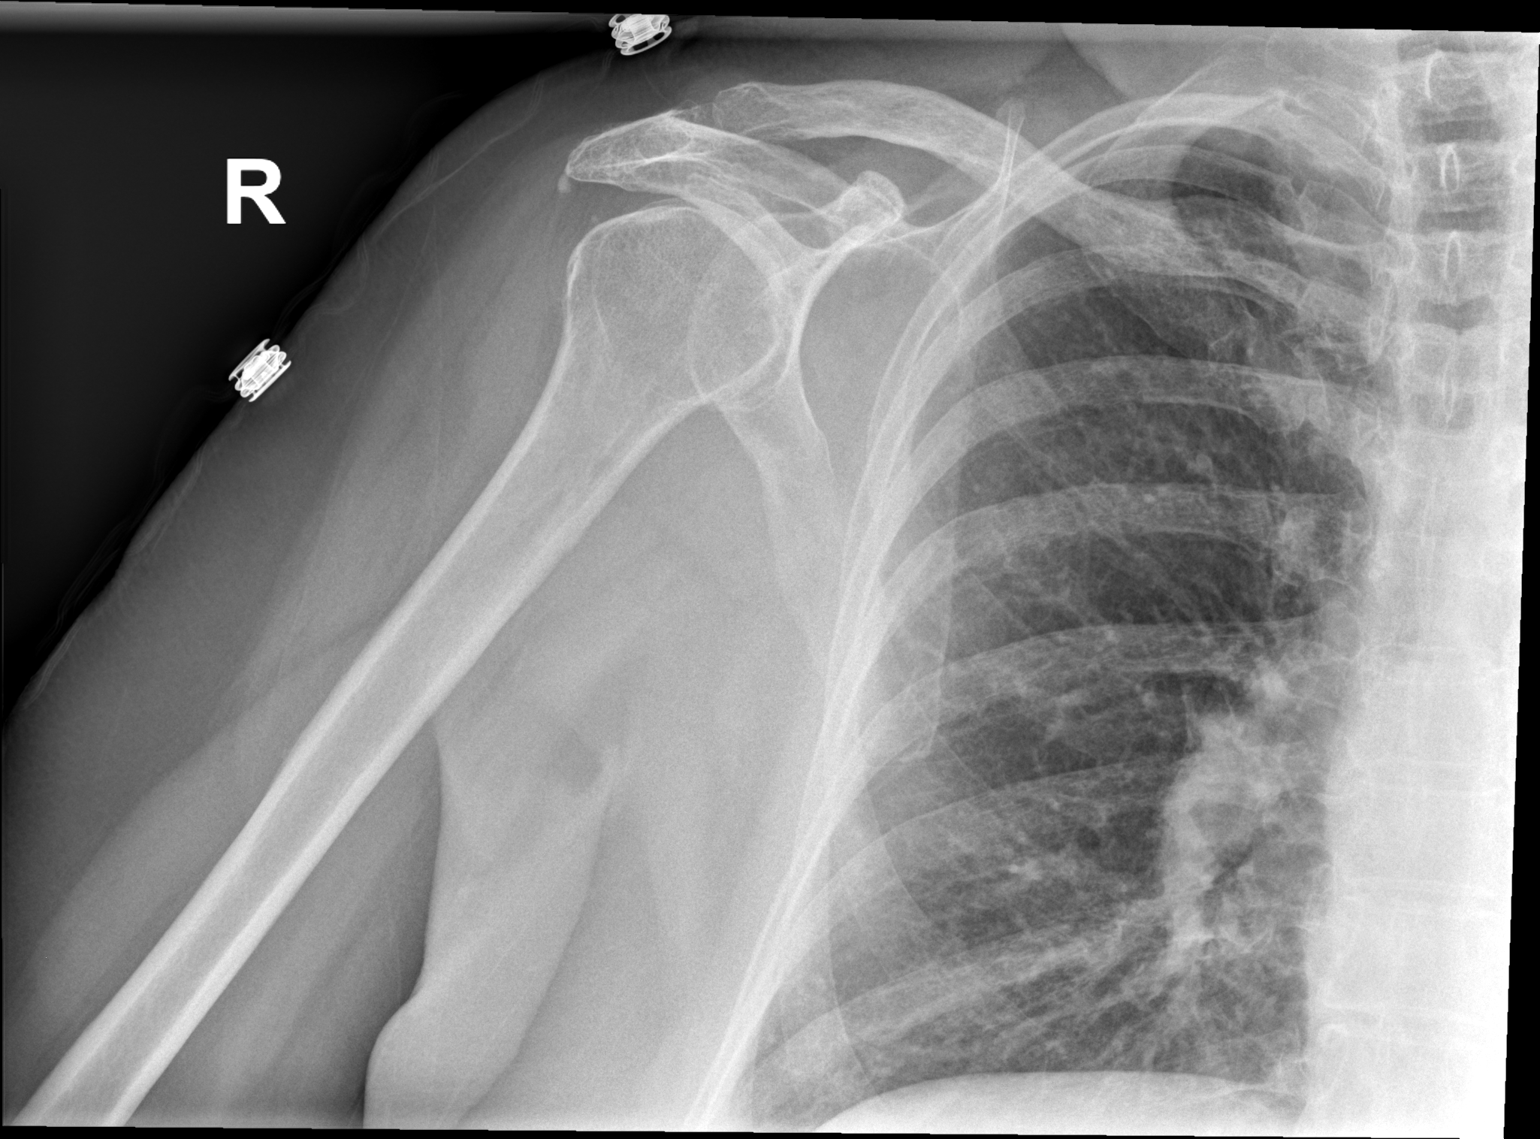

[x shoulder y-view ap right]
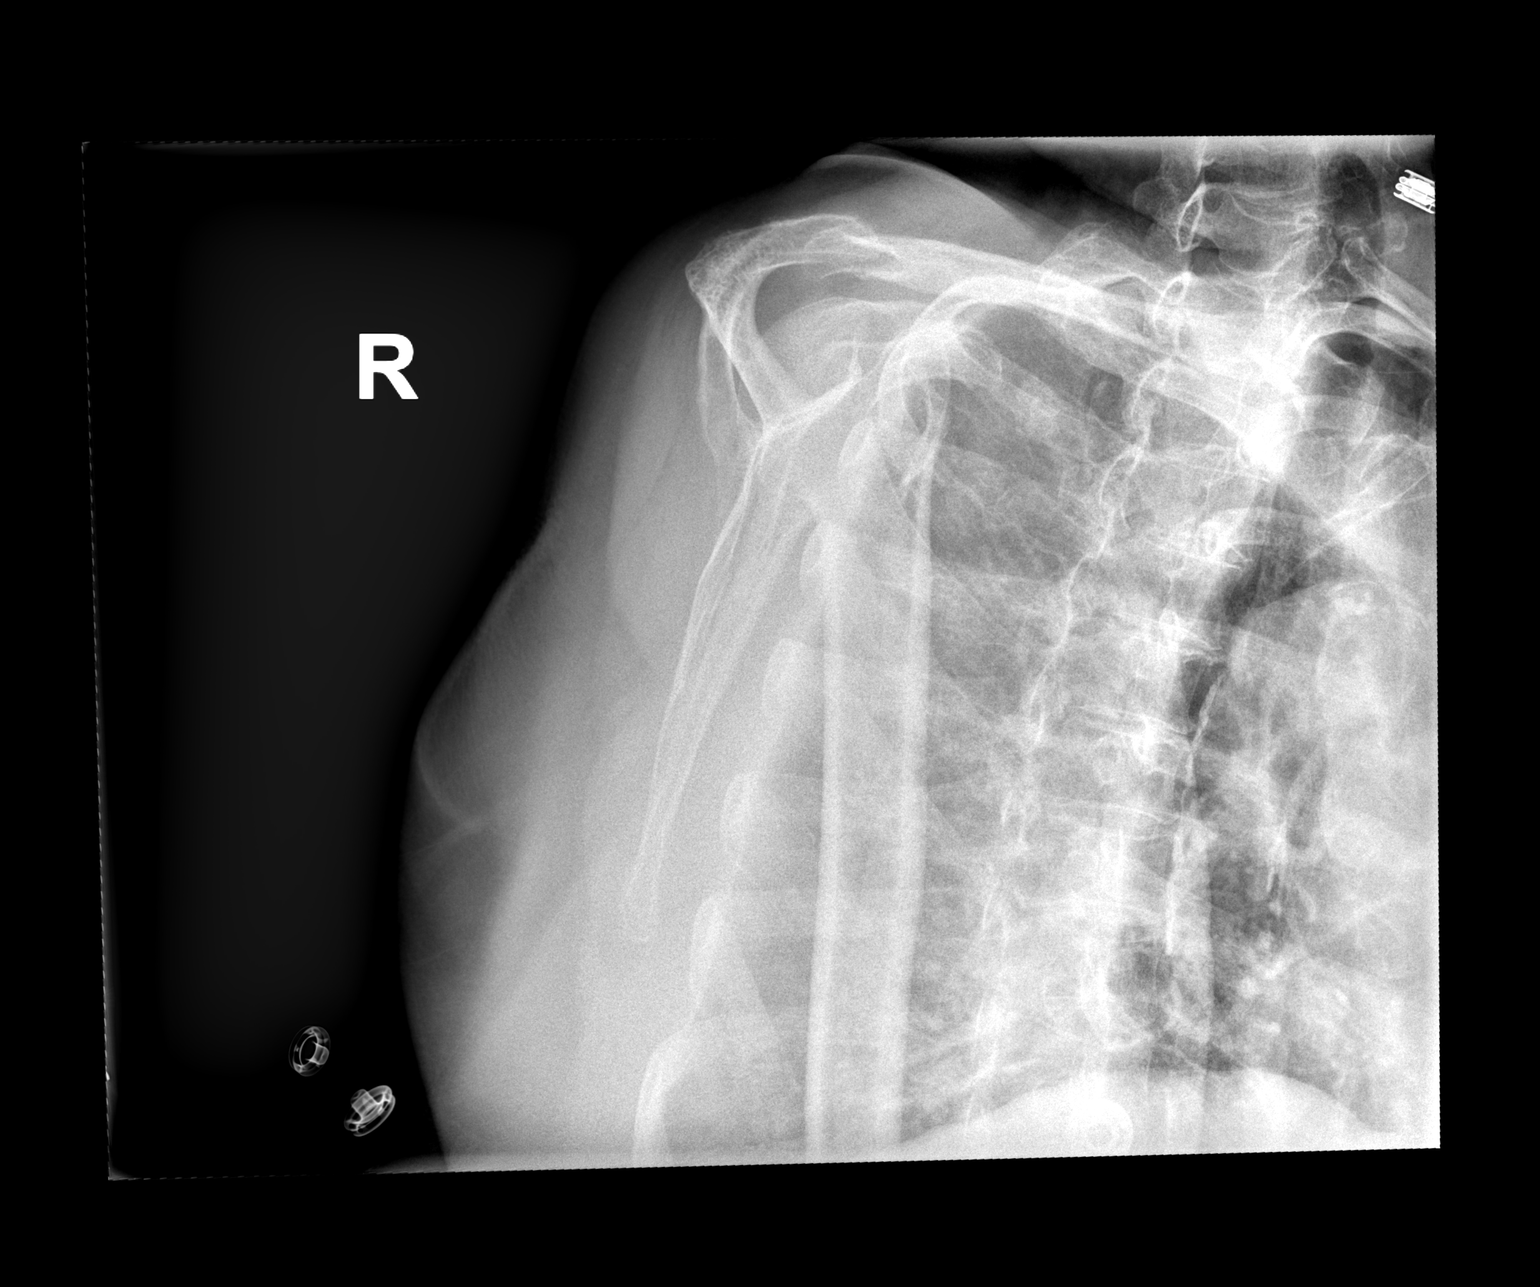

[3 of 3 positions shown; findings below may reference images not displayed]

EXAM

RADIOLOGICAL EXAMINATION, SHOULDER

INDICATION

right shoulder pain
PAIN IN RT SHOULDER AFTER FALL A FEW DAYS AGO.  NUMBNESS IN RT 4-5TH DIGITS AND ALONG ARM.  AB

COMPARISONS

None

FINDINGS

The bone density is maintained. There is no destruction.

The glenohumeral joint is intact. There is no dislocation.

The AC joint is intact on these nonstress views.

There is no displaced fracture.

IMPRESSION

There is no fracture or dislocation.

Tech Notes:

PAIN IN RT SHOULDER AFTER FALL A FEW DAYS AGO.  NUMBNESS IN RT 4-5TH DIGITS AND ALONG ARM.  AB

## 2021-11-28 ENCOUNTER — Encounter: Admit: 2021-11-28 | Discharge: 2021-11-28 | Payer: MEDICARE

## 2021-11-28 DIAGNOSIS — R69 Illness, unspecified: Secondary | ICD-10-CM

## 2022-01-12 ENCOUNTER — Encounter: Admit: 2022-01-12 | Discharge: 2022-01-12 | Payer: MEDICARE

## 2022-01-14 ENCOUNTER — Encounter: Admit: 2022-01-14 | Discharge: 2022-01-14 | Payer: MEDICARE

## 2022-01-14 DIAGNOSIS — S32020A Wedge compression fracture of second lumbar vertebra, initial encounter for closed fracture: Secondary | ICD-10-CM

## 2022-01-14 NOTE — Progress Notes
Dear Sharon Reese     Thank you for choosing [facility] Interventional Rad[facility] for your procedure. Your appointment information is listed below:     You are scheduled for a spinejack with Dr Bess Kinds on 4/5 at 1:30 PM.   Please check into admission of the [employer] [facility] at 1200pm and bring a driver.  ? [employer]: [address], [place], [place]  [address]  Parking: [address] Parking Garage        INTERVENTIONAL RADIOLOGY  PRE-PROCEDURE INSTRUCTIONS SEDATION     You are scheduled for a procedure in Interventional Radiology with procedural sedation.  Please follow these instructions and any direction from your Primary Care/Managing Physician.  If you have questions about your procedure or need to reschedule please call [phone].     Medication Instructions:   You may take your medications with a sip of water. Please hold Plavix for 5 days prior to procedure     Diet Instructions:  a. At 5 AM on 4/5, (8) hours before your procedure, stop your regular diet and start a clear liquid diet.  b. At 7 AMon 4/5, (6) hours before your procedure, discontinue tube feedings and chewing tobacco.  c. At 11 AM on 4/5, (2) hours before your procedure discontinue clear liquids.  You should have nothing by mouth. This includes GUM or CANDY.      Clear Liquid Diet     Water                 Apple or White Grape Juice        Coffee or tea without cream   Tea                    White Cranberry Juice                 Chicken Bouillon or Broth (no noodles)  Soda Pop           Popsicles                                     Beef Bouillon or Broth (no noodles)     Day of Exam Instructions:  1. Bathe or shower with an antibacterial soap prior to your appointment.  2. If you have a history of Obstructive Sleep Apnea (OSA) bring your CPAP/BIPAP.   3. Bring a list of your current medications and the dosages.  4. Wear comfortable clothing and leave valuables at home.  5. Arrive (1) hour prior to your appointment. This time will be spent registering, interviewing, assessing, educating and preparing you for the test.  ? You will be with [place] anywhere from 30 minutes to 6 hours after your exam depending on your procedure.  6. You may be sedated for the procedure. A responsible adult must drive you home (no [employer] Goad, taxis or buses are allowed) and stay with you overnight. If you do not have a driver we will be unable to perform your procedure.   7. You will not be able to return to work or drive the same day if receiving sedation.

## 2022-01-18 ENCOUNTER — Ambulatory Visit: Admit: 2022-01-18 | Discharge: 2022-01-18 | Payer: MEDICARE

## 2022-01-18 ENCOUNTER — Encounter: Admit: 2022-01-18 | Discharge: 2022-01-18 | Payer: MEDICARE

## 2022-01-18 DIAGNOSIS — E119 Type 2 diabetes mellitus without complications: Secondary | ICD-10-CM

## 2022-01-18 DIAGNOSIS — K5090 Crohn's disease, unspecified, without complications: Secondary | ICD-10-CM

## 2022-01-18 DIAGNOSIS — Z01818 Encounter for other preprocedural examination: Secondary | ICD-10-CM

## 2022-01-18 DIAGNOSIS — I350 Nonrheumatic aortic (valve) stenosis: Secondary | ICD-10-CM

## 2022-01-18 DIAGNOSIS — S32020A Wedge compression fracture of second lumbar vertebra, initial encounter for closed fracture: Secondary | ICD-10-CM

## 2022-01-18 DIAGNOSIS — K219 Gastro-esophageal reflux disease without esophagitis: Secondary | ICD-10-CM

## 2022-01-18 DIAGNOSIS — I639 Cerebral infarction, unspecified: Secondary | ICD-10-CM

## 2022-01-18 DIAGNOSIS — E785 Hyperlipidemia, unspecified: Secondary | ICD-10-CM

## 2022-01-18 DIAGNOSIS — I10 Essential (primary) hypertension: Secondary | ICD-10-CM

## 2022-01-18 LAB — BASIC METABOLIC PANEL
ANION GAP: 13 g/dL — ABNORMAL HIGH (ref 3–12)
CALCIUM: 9.2 mg/dL (ref 8.5–10.6)
CHLORIDE: 102 MMOL/L (ref 98–110)
CO2: 27 MMOL/L (ref 21–30)
CREATININE: 0.6 mg/dL (ref 0.4–1.00)
EGFR: >60 mL/min (ref >60)
GLUCOSE,PANEL: 98 mg/dL — ABNORMAL HIGH (ref 70–100)
POTASSIUM: 3.3 MMOL/L — ABNORMAL LOW (ref 3.5–5.1)
SODIUM: 142 MMOL/L (ref 137–147)

## 2022-01-18 LAB — CBC
PLATELET COUNT: 295 K/UL (ref 150–400)
RBC COUNT: 3.6 M/UL — ABNORMAL LOW (ref 4.0–5.0)
WBC COUNT: 5 K/UL (ref 4.5–11.0)

## 2022-01-18 NOTE — Pre-Anesthesia Patient Instructions
GENERAL INFORMATION    Before you come to the hospital  If you are having an outpatient procedure, you will need to arrange for a responsible ride/person to accompany you home due to sedation or anesthesia with your procedure. A responsible person is a person who has the ability to identify a change in the patient's status and notify medical personnel.  This is typically a family member or friend.  Public transportation is permitted if you have a responsible person to accompany you.  An [employer] G[employer], taxi or other public transportation driver is not considered a responsible person to accompany you home.  Bath/Shower Instructions  Take a bath or shower using the special soap given to you in PAC. Use half the bottle the night before, and the other half the morning of your procedure. Use clean towels with each bath or shower.  Put on clean clothes after bath or shower.  Avoid using lotion and oils.  Leave money, credit cards, jewelry, and any other valuables at home. [facility] is not responsible for the loss or breakage of personal items.  Remove nail polish, makeup and all jewelry (including piercings) before coming to the hospital.  The morning of your procedure:  brush your teeth and tongue  do not smoke, vape, chew or use any tobacco products  do not shave the area where you will have surgery    What to bring to the hospital  ID/ Insurance Card  Medical Device card  Official documents for legal guardianship   Copy of your Living Will, Advanced Directives, and/or Durable Power of Attorney.  If you have these documents, please bring them to the admissions office on the day of your surgery to be scanned into your records.  Small bag with a few personal belongings  CPAP/BiPAP machine (including all supplies)  Walker, cane, or motorized scooter  Cases for glasses/hearing aids/contact lens (bring solutions for contacts)  Dress in clean, loose, comfortable clothing     Preparing to get your medications at discharge  Your surgeon may prescribe you medications to take after your procedure.  If you would like the convenience of having your medications filled here at [place] please do one of the following:  Go to [place] pharmacy after your [facility] appointment to put a credit card on file.  Call [place] pharmacy at [phone] (Monday-Friday 7am-9pm or Saturday and Sunday 9am-5pm) to put a credit card on file.  Bring a credit card or cash on the day of your procedure- please leave with a family member rather than bringing it into the preop area.     Eating or drinking before surgery  Do not eat or drink anything after 11:00 p.m. the day before your procedure (including gum, mints, candy, or chewing tobacco) OR follow the specific instructions you were given by your Surgeon.  You may have WATER ONLY up to 2 hours before arriving at the hospital.     Other instructions  Notify your surgeon if:  there is a possibility that you are pregnant   you become ill with a cough, fever, sore throat, nausea, vomiting or flu-like symptoms  you have any open wounds/sores that are red, painful, draining, or are new since you last saw the doctor  you need to cancel your procedure    On the day of your procedure, notify us at [facility]: [phone]  if you need to cancel your procedure  if you are going to be late    Arrival at  the hospital  [facility]  [address]  [place], [place] [address]    Park in the P5 parking garage located at [address], [place], [place] [address].   If parking in the P5 garage, take the east elevators in the parking garage to the second level and walk to the entrance of the [employer].    Enter through the 1st floor main entrance and check in with Information Desk.   If you are a woman between the ages of 21 and 62, and have not had a hysterectomy, you will be asked for a urine sample prior to surgery.  Please do not urinate before arriving in the Surgery Waiting Room.  Once there, check in and let the attendant know if you need to provide a sample.    You will receive a call with your surgery arrival time between 2:30pm and 4:30pm the last business day before your procedure.  If you do not receive a call, please call [phone] before 4:30pm or [phone] after 4:30pm.          For the safety of all patients, visitors and staff as we work to contain COVID-19, we must restrict patient visitors.    Current Visitor Policy (05/17/21):    Our current, and ongoing, visitor rules in surgery and procedural areas are:    2 visitors per patient will be allowed to accompany the patient and wait in the Waiting Room.     Patients in inpatient and pediatric units, Emergency Department, ambulatory clinics and lab appointments may only have two visitors.       For inpatient stays, patients may have 2 visitors at a time at their bedside. The two visitors can change throughout the day, but no more than two at a time may be bedside.  The policy applies to [facility] [place] [facility] [place], [address], [employer] and [place] campuses and clinics.    Exceptions include:  No visitors allowed for patients with active COVID-19 infections.  Children younger than age 64 are allowed to visit inpatients.  Two parents/guardians are allowed for surgical or procedural patients younger than 73 years old.  Adult inpatients in semiprivate rooms may have visitors, but visits should be coordinated so only two total visitors are in a room at a time due to space limitations.    Visitors must be free of fever and symptoms to be in our facilities. We ask visitors to follow these guidelines:  Wear a mask at all times, unless under the age of 2, have trouble breathing or are unconscious, incapacitated or otherwise unable to remove the cover without assistance.  Go directly to the nursing station in the unit you are visiting and do not linger in public areas.  Check in at the nursing station before going to the patient's room.  Maintain a physical distance of six feet from all others.  Follow elevator restrictions to four riding at a time - peak times are 6:30-7:30 a.m., noon and 6:30-7:30 p.m.  Be aware cafeteria peak times are 11 a.m. - 1 p.m.  Wash your hands frequently and cover your coughs and sneezes.

## 2022-01-18 NOTE — Progress Notes
Preoperative Medication Plan Note:    Sharon Reese was seen in the [facility] on 01/18/22.  As part of the visit, an accurate medication list was obtained and the patient was given pre-op medication instructions for upcoming surgery on 01/19/22 with Dr. Theora Gianotti.      Plavix: Dr. Ester Rink (PCP), approved plavix hold for 4 days prior to surgery, with the last dose on 01/15/22.    The plan above was communicated to the patient vis phone and they verbalized understanding.    [name] [facility] [facility], PHARMD

## 2022-01-19 ENCOUNTER — Encounter: Admit: 2022-01-19 | Discharge: 2022-01-19 | Payer: MEDICARE

## 2022-01-19 ENCOUNTER — Ambulatory Visit: Admit: 2022-01-19 | Discharge: 2022-01-19 | Payer: MEDICARE

## 2022-01-19 DIAGNOSIS — E785 Hyperlipidemia, unspecified: Secondary | ICD-10-CM

## 2022-01-19 DIAGNOSIS — I639 Cerebral infarction, unspecified: Secondary | ICD-10-CM

## 2022-01-19 DIAGNOSIS — K219 Gastro-esophageal reflux disease without esophagitis: Secondary | ICD-10-CM

## 2022-01-19 DIAGNOSIS — I350 Nonrheumatic aortic (valve) stenosis: Secondary | ICD-10-CM

## 2022-01-19 DIAGNOSIS — I10 Essential (primary) hypertension: Secondary | ICD-10-CM

## 2022-01-19 DIAGNOSIS — S32020A Wedge compression fracture of second lumbar vertebra, initial encounter for closed fracture: Secondary | ICD-10-CM

## 2022-01-19 DIAGNOSIS — K5090 Crohn's disease, unspecified, without complications: Secondary | ICD-10-CM

## 2022-01-19 DIAGNOSIS — E119 Type 2 diabetes mellitus without complications: Secondary | ICD-10-CM

## 2022-01-19 LAB — POC CREATININE, RAD: CREATININE, POC: 0.7 mg/dL (ref 0.4–1.00)

## 2022-01-19 MED ORDER — FENTANYL CITRATE (PF) 50 MCG/ML IJ SOLN
25 ug | INTRAVENOUS | 0 refills | PRN
Start: 2022-01-19 — End: ?

## 2022-01-19 MED ORDER — CEFAZOLIN INJ 1GM IVP
1 g | Freq: Once | INTRAVENOUS | 0 refills | Status: CP
Start: 2022-01-19 — End: ?
  Administered 2022-01-19: 20:00:00 2 g via INTRAVENOUS

## 2022-01-19 MED ORDER — FENTANYL CITRATE (PF) 50 MCG/ML IJ SOLN
INTRAVENOUS | 0 refills | Status: DC
Start: 2022-01-19 — End: 2022-01-19
  Administered 2022-01-19: 20:00:00 25 ug via INTRAVENOUS
  Administered 2022-01-19 (×2): 12.5 ug via INTRAVENOUS

## 2022-01-19 MED ORDER — TRAMADOL 50 MG PO TAB
50 mg | Freq: Once | ORAL | 0 refills | Status: CP
Start: 2022-01-19 — End: ?
  Administered 2022-01-19: 23:00:00 50 mg via ORAL

## 2022-01-19 MED ORDER — LIDOCAINE (PF) 20 MG/ML (2 %) IJ SOLN
INTRAVENOUS | 0 refills | Status: DC
Start: 2022-01-19 — End: 2022-01-19
  Administered 2022-01-19: 20:00:00 40 mg via INTRAVENOUS

## 2022-01-19 MED ORDER — PHENYLEPHRINE HCL IN 0.9% NACL 1 MG/10 ML (100 MCG/ML) IV SYRG
INTRAVENOUS | 0 refills | Status: DC
Start: 2022-01-19 — End: 2022-01-19
  Administered 2022-01-19 (×5): 100 ug via INTRAVENOUS

## 2022-01-19 MED ORDER — ACETAMINOPHEN 1,000 MG/100 ML (10 MG/ML) IV SOLN
INTRAVENOUS | 0 refills | Status: DC
Start: 2022-01-19 — End: 2022-01-19
  Administered 2022-01-19: 20:00:00 1000 mg via INTRAVENOUS

## 2022-01-19 MED ORDER — PROPOFOL 10 MG/ML IV EMUL 20 ML (INFUSION)(AM)(OR)
INTRAVENOUS | 0 refills | Status: DC
Start: 2022-01-19 — End: 2022-01-19
  Administered 2022-01-19: 20:00:00 100 ug/kg/min via INTRAVENOUS
  Administered 2022-01-19: 20:00:00 30 mg via INTRAVENOUS

## 2022-01-19 MED ORDER — OXYCODONE 5 MG PO TAB
5 mg | Freq: Once | ORAL | 0 refills | PRN
Start: 2022-01-19 — End: ?

## 2022-01-19 MED ORDER — LACTATED RINGERS IV SOLP
INTRAVENOUS | 0 refills | Status: DC
Start: 2022-01-19 — End: 2022-01-19
  Administered 2022-01-19 (×2): via INTRAVENOUS

## 2022-01-19 MED ORDER — FENTANYL CITRATE (PF) 50 MCG/ML IJ SOLN
12.5 ug | INTRAVENOUS | 0 refills | PRN
Start: 2022-01-19 — End: ?

## 2022-01-19 NOTE — Progress Notes
Pt recovery complete.     Ambulatory status: Wheelchair- Transfers without Assist .   Vitals stable. Dressing is clean, dry, and intact.  Pain level returned to baseline.  Discharge instructions reviewed with: patient  AVS signed and provided to patient.    Pt discharged to main lobby via wheelchair transport.

## 2022-01-19 NOTE — Other
Immediate Post Procedure Note    Date:  01/19/2022                                         Attending Physician:   Colena Ketterman MD    Procedure(s):  L2 vertebral augmentation/spine jack  Pre/Post Diagnosis:  Back pain, moderate compression fx  Description/Findings:  Moderate to severe compression fx  Anesthesia:  2% lidocaine   MAC    Time out performed: Consent obtained, correct patient verified, correct procedure verified, correct site verified, patient marked as necessary.  Estimated Blood Loss:  None/Negligible  Specimen(s) Removed/Disposition:  None  Complications: None    Missy Sabins, MD

## 2022-01-19 NOTE — H&P (View-Only)
IR Pre-Procedure History and Physical/Sedation Plan    Procedure Date: 01/19/2022     Planned Procedure(s):  L2 vertebral augmentation    Procedural code status: No Order    Indication:  L2 fracture- reports back pain is 9/10 when it gets going. Pt reports that pain is low back, referencing her tailbone.   __________________________________________________________________    Chief Complaint:  See above    History of Present Illness: Sharon Reese is a 71 y.o. female with a history as listed below who presents today for procedure.    There are no problems to display for this patient.    Medical History:   Diagnosis Date   ? Compression fracture of L2 vertebra (HCC)    ? Crohn disease (HCC)    ? CVA (cerebral vascular accident) [facility])    ? Diabetes (HCC)    ? GERD (gastroesophageal reflux disease)    ? HTN (hypertension)    ? Hyperlipidemia    ? Mild aortic stenosis by prior echocardiogram       Surgical History:   Procedure Laterality Date   ? HX JOINT REPLACEMENT  2013    Rt Knee   ? ABDOMEN SURGERY      Bowel Resec x2 for obstruction   ? HX BLADDER SUSPENSION     ? HX HYSTERECTOMY        Social History     Tobacco Use   ? Smoking status: Never   ? Smokeless tobacco: Never   Substance Use Topics   ? Alcohol use: Not Currently      No family history on file.   Medications Prior to Admission   Medication Sig Dispense Refill Last Dose   ? adalimumab (HUMIRA) 40 mg/0.8 mL injection Inject 0.8 mL under the skin every 14 days.      ? amitriptyline (ELAVIL) 10 mg tablet Take one tablet by mouth at bedtime daily.      ? amLODIPine (NORVASC) 10 mg tablet Take one tablet by mouth daily.      ? ascorbic acid (vitamin C) 500 mg tablet Take one tablet by mouth daily.      ? aspirin EC 81 mg tablet Take one tablet by mouth daily. Take with food.      ? atorvastatin (LIPITOR) 80 mg tablet Take one tablet by mouth daily.      ? calcitonin salmon (MIACALCIN) 200 unit/actuation nasal spray Apply one spray to one nostril as directed daily.      ? CHOLEcalciferoL (vitamin D3) (VITAMIN D3) 1,000 units tablet Take one tablet by mouth daily.      ? clopiDOGreL (PLAVIX) 75 mg tablet Take one tablet by mouth daily.      ? cyanocobalamin (vit B-12) (RUBRAMIN PC) 1,000 mcg/mL injection solution Inject 1 mL into the muscle every 30 days.      ? dapagliflozin (FARXIGA) 10 mg tablet Take one tablet by mouth daily.      ? duloxetine DR (CYMBALTA) 30 mg capsule Take one capsule by mouth daily.      ? duloxetine DR (CYMBALTA) 60 mg capsule Take one capsule by mouth daily.      ? latanoprost (XALATAN) 0.005 % ophthalmic solution Apply one drop to both eyes at bedtime daily.      ? mercaptopurine (PURINETHOL) 50 mg tablet Take one tablet by mouth daily. ** CYTOTOXIC **Take on an empty stomach, at least 1 hour before or 2 hours after food.      ? metFORMIN-XR (GLUCOPHAGE  XR) 500 mg extended release tablet Take  by mouth twice daily before meals. 2 tablets before breakfast and 1 tablet before dinner      ? metoprolol succinate XL (TOPROL XL) 100 mg extended release tablet Take one tablet by mouth daily.      ? multivitamin (ONE-A-DAY) tablet Take one tablet by mouth daily.      ? olmesartan (BENICAR) 40 mg tablet Take one tablet by mouth daily.      ? omeprazole DR (PRILOSEC) 20 mg capsule Take one capsule by mouth at bedtime daily.      ? pregabalin (LYRICA) 100 mg capsule Take one capsule by mouth three times daily.      ? traMADoL (ULTRAM) 50 mg tablet Take one tablet by mouth every 8 hours as needed for Pain.      ? triamterene-hydrochlorothiazide (MAXZIDE) 75-50 mg tablet Take one tablet by mouth every morning.        Allergies   Allergen Reactions   ? Hydrocodone MENTAL STATUS CHANGES     Groggy, slow cognition, was in a hydrocodone fog   ? Remicade [Infliximab] HIVES and SHORTNESS OF BREATH   ? Codeine HEADACHE       Review of Systems  Constitutional: negative for fevers and chills  Respiratory: negative for cough or sputum  Cardiovascular: negative  Gastrointestinal: negative for nausea and vomiting    Previous Personal Anesthetic/Sedation History:  Denies adverse events related to sedation/anesthesia.     Previous Family Anesthetic/Sedation History: Denies adverse events related to sedation/anesthesia.    Physical Exam:  Vital Signs: Last Filed In 24 Hours Vital Signs: 24 Hour Range   BP: 118/57 (04/05 1312)  Pulse: 63 (04/05 1312)  Respirations: 12 PER MINUTE (04/05 1312)  SpO2: 98 % (04/05 1312) BP: (118)/(57)   Pulse:  [63]   Respirations:  [12 PER MINUTE]   SpO2:  [98 %]           General appearance: Alert and no distress noted.  Neurologic: Grossly normal.  Lungs: Non labored at rest.  Heart: Regular rate and rhythm  Abdomen: Non-distended    Airway: airway assessment performed  Mallampati II (soft palate, uvula, fauces visible)  Head and Neck: no abnormalities noted  Mouth: no abnormalities noted  NPO status: Acceptable  Pregnancy Status: Not Pregnant  Anesthesia Classification:  ASA III (A patient with a severe systemic disease that limits activity, but is not incapacitating)  Pre-operative anxiolysis Plan: MAC (Monitored Anesthesia Care) and General Anesthesia  Sedation/Medication Plan: MAC (Monitored Anesthesia Care) and General Anesthesia  Discussion/Reviews:  Physician has discussed risks and alternatives of this type of sedation and above planned procedures with patient    Lab/Radiology/Other Diagnostic Tests:  Labs:  Pertinent labs reviewed           Dollene Primrose, APRN-NP  Pager [phone]

## 2022-01-19 NOTE — Progress Notes
Anesthesia at bedside to check patient out. Provided patient with water. Warming lights on. Offered to reposition, but patient refused at this time. No other needs verbalized at this time.

## 2022-01-19 NOTE — Progress Notes
Anesthesia staff present to monitor patient airway, vital signs, and medications. See anesthesia docflow. This RN will assist as needed.

## 2022-02-04 ENCOUNTER — Encounter: Admit: 2022-02-04 | Discharge: 2022-02-04 | Payer: MEDICARE

## 2022-02-04 DIAGNOSIS — M533 Sacrococcygeal disorders, not elsewhere classified: Secondary | ICD-10-CM

## 2022-02-04 NOTE — Progress Notes
Interventional Radiology Progress Note    Follow up call made to patient s/p spine jack. Pt reports doing really well post procedure and feels like it has helped her back. She does still have pain in her tailbone, per Dr Bess Kinds could get a sacral MRI to check for a sacral fracture. Will place order and send to [facility] for patient to have completed there and then will request images once completed.     Chrystie Nose, RN

## 2022-02-23 ENCOUNTER — Encounter: Admit: 2022-02-23 | Discharge: 2022-02-23 | Payer: MEDICARE

## 2022-02-27 ENCOUNTER — Encounter: Admit: 2022-02-27 | Discharge: 2022-02-27 | Payer: MEDICARE

## 2022-03-01 ENCOUNTER — Encounter: Admit: 2022-03-01 | Discharge: 2022-03-01 | Payer: MEDICARE

## 2022-03-01 DIAGNOSIS — M533 Sacrococcygeal disorders, not elsewhere classified: Secondary | ICD-10-CM

## 2022-03-01 NOTE — Patient Education
Dear Sharon Reese,     Thank you for choosing [facility] Interventional Radiology for your procedure. Your appointment information is listed below:     We have your surgery scheduled for 03/10/22 at 12:00 PM  ? Check into admissions radiology on the first floor at 10:30 AM [facility]   ? [facility] is located at [employer] and you can park in the P5 parking garage next to the tower  ? Nothing to eat or drink after midnight.       INTERVENTIONAL RADIOLOGY  PRE-PROCEDURE INSTRUCTIONS ANESTHESIA     You are scheduled for a procedure in Interventional Radiology with procedural sedation.  Please follow these instructions and any direction from your Primary Care/Managing Physician.  If you have questions about your procedure or need to reschedule please call [phone].     Medication Instructions:   Please hold Plavix 5 days prior to your procedure ans Aspirin the day of your procedure. You may take your other medications with a sip of water.        Diet Instructions:  a. (8) hours before your procedure, stop your regular diet and start a clear liquid diet.  b. (6) hours before your procedure, discontinue tube feedings and chewing tobacco.  c. (2) hours before your procedure discontinue clear liquids.  You should have nothing by mouth. This includes GUM or CANDY.      Clear Liquid Diet     Water                 Apple or White Grape Juice        Coffee or tea without cream   Tea                    White Cranberry Juice                 Chicken Bouillon or Broth (no noodles)  Soda Pop           Popsicles                                     Beef Bouillon or Broth (no noodles)     Day of Exam Instructions:  1. Bathe or shower with an antibacterial soap prior to your appointment.  2. If you have a history of Obstructive Sleep Apnea (OSA) bring your CPAP/BIPAP.   3. Bring a list of your current medications and the dosages.  4. Wear comfortable clothing and leave valuables at home.  5. Arrive 90 minutes prior to your appointment.  This time will be spent registering, interviewing, assessing, educating and preparing you for the test.

## 2022-03-02 NOTE — Progress Notes
Interventional Radiology Progress Note    Dr. Tonna Corner, [facility], provided clearance for patient to hold Plavix 5 days prior to sacroplasty scheduled 03/10/22.    Polo Riley, RN

## 2022-03-04 ENCOUNTER — Encounter: Admit: 2022-03-04 | Discharge: 2022-03-04 | Payer: MEDICARE

## 2022-03-09 ENCOUNTER — Encounter: Admit: 2022-03-09 | Discharge: 2022-03-09 | Payer: MEDICARE

## 2022-03-10 ENCOUNTER — Ambulatory Visit: Admit: 2022-03-10 | Discharge: 2022-03-10 | Payer: MEDICARE

## 2022-03-10 ENCOUNTER — Encounter: Admit: 2022-03-10 | Discharge: 2022-03-10 | Payer: MEDICARE

## 2022-03-10 DIAGNOSIS — K5090 Crohn's disease, unspecified, without complications: Secondary | ICD-10-CM

## 2022-03-10 DIAGNOSIS — M533 Sacrococcygeal disorders, not elsewhere classified: Secondary | ICD-10-CM

## 2022-03-10 DIAGNOSIS — I350 Nonrheumatic aortic (valve) stenosis: Secondary | ICD-10-CM

## 2022-03-10 DIAGNOSIS — E119 Type 2 diabetes mellitus without complications: Secondary | ICD-10-CM

## 2022-03-10 DIAGNOSIS — I639 Cerebral infarction, unspecified: Secondary | ICD-10-CM

## 2022-03-10 DIAGNOSIS — I10 Essential (primary) hypertension: Secondary | ICD-10-CM

## 2022-03-10 DIAGNOSIS — K219 Gastro-esophageal reflux disease without esophagitis: Secondary | ICD-10-CM

## 2022-03-10 DIAGNOSIS — S32020A Wedge compression fracture of second lumbar vertebra, initial encounter for closed fracture: Secondary | ICD-10-CM

## 2022-03-10 DIAGNOSIS — E785 Hyperlipidemia, unspecified: Secondary | ICD-10-CM

## 2022-03-10 MED ORDER — ACETAMINOPHEN 1,000 MG/100 ML (10 MG/ML) IV SOLN
INTRAVENOUS | 0 refills | Status: DC
Start: 2022-03-10 — End: 2022-03-10
  Administered 2022-03-10: 20:00:00 1000 mg via INTRAVENOUS

## 2022-03-10 MED ORDER — LACTATED RINGERS IV SOLP
INTRAVENOUS | 0 refills | Status: DC
Start: 2022-03-10 — End: 2022-03-10
  Administered 2022-03-10: 20:00:00 via INTRAVENOUS

## 2022-03-10 MED ORDER — CEFAZOLIN INJ 1GM IVP
2 g | Freq: Once | INTRAVENOUS | 0 refills | Status: CP
Start: 2022-03-10 — End: ?
  Administered 2022-03-10: 20:00:00 2 g via INTRAVENOUS

## 2022-03-10 MED ORDER — LIDOCAINE (PF) 20 MG/ML (2 %) IJ SOLN
INTRAVENOUS | 0 refills | Status: DC
Start: 2022-03-10 — End: 2022-03-10
  Administered 2022-03-10 (×2): 40 mg via INTRAVENOUS

## 2022-03-10 MED ORDER — PHENYLEPHRINE HCL IN 0.9% NACL 1 MG/10 ML (100 MCG/ML) IV SYRG
INTRAVENOUS | 0 refills | Status: DC
Start: 2022-03-10 — End: 2022-03-10
  Administered 2022-03-10 (×14): 100 ug via INTRAVENOUS

## 2022-03-10 MED ORDER — SODIUM CHLORIDE 0.9 % IV SOLP
INTRAVENOUS | 0 refills | Status: DC
Start: 2022-03-10 — End: 2022-03-10
  Administered 2022-03-10: 20:00:00 via INTRAVENOUS

## 2022-03-10 MED ORDER — FENTANYL CITRATE (PF) 50 MCG/ML IJ SOLN
INTRAVENOUS | 0 refills | Status: DC
Start: 2022-03-10 — End: 2022-03-10
  Administered 2022-03-10: 20:00:00 50 ug via INTRAVENOUS

## 2022-03-10 MED ORDER — PROPOFOL 10 MG/ML IV EMUL 100 ML (INFUSION)(AM)(OR)
INTRAVENOUS | 0 refills | Status: DC
Start: 2022-03-10 — End: 2022-03-10
  Administered 2022-03-10: 20:00:00 80 ug/kg/min via INTRAVENOUS

## 2022-03-10 NOTE — Progress Notes
Anesthesia staff present to monitor patient airway, vital signs, and medications. See anesthesia docflow. This RN will assist as needed.

## 2022-03-10 NOTE — Other
Immediate Post Procedure Note    Date:  03/10/2022                                         Attending Physician:   Dr. Bess Kinds  Performing Provider:  Gilmer Mor, MD    Consent:  Consent obtained from patient.  Time out performed: Consent obtained, correct patient verified, correct procedure verified, correct site verified, patient marked as necessary.  Pre/Post Procedure Diagnosis:  Sacral fracture  Indications:  Above      Procedure(s):  Vertebral Augmentation  Findings:      Successful S4 vertebroplasty. Good interdigitation of cement within the left aspect of  S4 body, 2/2 to cement hardening and sclerosis, tubular filling of cement within the midline and right S4 vertebral body. Small amount of extra osseus cement lateral to the sacrum.      Estimated Blood Loss:  None/Negligible  Specimen(s) Removed/Disposition:  None  Complications: None  Patient Tolerated Procedure: Well  Post-Procedure Condition:  stable    Gilmer Mor, MD  Pager

## 2022-03-10 NOTE — Anesthesia Post-Procedure Evaluation
Post-Anesthesia Evaluation    Name: Sharon Reese      MRN: [id number]     DOB: [birth year]     Age: 71 y.o.     Sex: female   __________________________________________________________________________     Procedure Information     Anesthesia Start Date/Time: 03/10/22 1432    Procedure: CT GUIDE NEEDLE PLACEMENT    Location: Imaging, CT: [facility]          Post-Anesthesia Vitals  BP: 125/75 (05/25 1700)  Temp: 36.6 C (97.9 F) (05/25 1635)  Pulse: 66 (05/25 1700)  Respirations: 18 PER MINUTE (05/25 1700)  SpO2: 93 % (05/25 1700)  SpO2 Pulse: 71 (05/25 1700)   Vitals Value Taken Time   BP 125/75 03/10/22 1700   Temp     Pulse 66 03/10/22 1700   Respirations 18 PER MINUTE 03/10/22 1700   SpO2 93 % 03/10/22 1700   O2 Device     ABP     ART BP           Post Anesthesia Evaluation Note    Evaluation location: Pre/Post  Patient participation: recovered; patient participated in evaluation  Level of consciousness: alert    Pain score: 0  Pain management: adequate    Hydration: normovolemia  Temperature: 36.0C - 38.4C  Airway patency: adequate    Perioperative Events       Post-op nausea and vomiting: no PONV    Postoperative Status  Cardiovascular status: hemodynamically stable  Respiratory status: spontaneous ventilation  Follow-up needed: none  Additional comments: Post-Anesthesia Evaluation Attestation: I reviewed and agree the indicated post-anesthesia care was provided. I have reviewed key portions of the indicated post anesthesia care. I have examined the patient's vitals, physical status, and complications and agree with what is documented.    Staff name:  Loni Muse, MD Date:  03/10/2022            Perioperative Events  There were no known notable events for this encounter.

## 2022-03-10 NOTE — Anesthesia Pre-Procedure Evaluation
Anesthesia Pre-Procedure Evaluation    Name: Sharon Reese      MRN: [id number]     DOB: 02/08/1951     Age: 71 y.o.     Sex: female   _________________________________________________________________________     Procedure Info:   Procedure Information     Date/Time: 03/10/22 1300    Procedure: IR LUMBAR KYPHOPLASTY/VERTEBROPLASTY    Location: Imaging, CT: [facility]          Physical Assessment  Vital Signs (last filed in past 24 hours):  BP: 128/70 (05/25 1130)  Pulse: 68 (05/25 1130)  Respirations: 13 PER MINUTE (05/25 1130)  SpO2: 93 % (05/25 1130)  SpO2 Pulse: 68 (05/25 1130)      Patient History   Allergies   Allergen Reactions   ? Hydrocodone MENTAL STATUS CHANGES     Groggy, slow cognition, was in a hydrocodone fog   ? Remicade [Infliximab] HIVES and SHORTNESS OF BREATH   ? Codeine HEADACHE        Current Medications    Medication Directions   adalimumab (HUMIRA) 40 mg/0.8 mL injection Inject 0.8 mL under the skin every 14 days.   amitriptyline (ELAVIL) 10 mg tablet Take one tablet by mouth at bedtime daily.   amLODIPine (NORVASC) 10 mg tablet Take one tablet by mouth daily.   ascorbic acid (vitamin C) 500 mg tablet Take one tablet by mouth daily.   aspirin EC 81 mg tablet Take one tablet by mouth daily. Take with food.   atorvastatin (LIPITOR) 80 mg tablet Take one tablet by mouth daily.   calcitonin salmon (MIACALCIN) 200 unit/actuation nasal spray Apply one spray to one nostril as directed daily.   CHOLEcalciferoL (vitamin D3) (VITAMIN D3) 1,000 units tablet Take one tablet by mouth daily.   clopiDOGreL (PLAVIX) 75 mg tablet Take one tablet by mouth daily.   cyanocobalamin (vit B-12) (RUBRAMIN PC) 1,000 mcg/mL injection solution Inject 1 mL into the muscle every 30 days.   dapagliflozin (FARXIGA) 10 mg tablet Take one tablet by mouth daily.   duloxetine DR (CYMBALTA) 30 mg capsule Take one capsule by mouth daily.   duloxetine DR (CYMBALTA) 60 mg capsule Take one capsule by mouth daily. latanoprost (XALATAN) 0.005 % ophthalmic solution Apply one drop to both eyes at bedtime daily.   mercaptopurine (PURINETHOL) 50 mg tablet Take one tablet by mouth daily. ** CYTOTOXIC **Take on an empty stomach, at least 1 hour before or 2 hours after food.   metFORMIN-XR (GLUCOPHAGE XR) 500 mg extended release tablet Take  by mouth twice daily before meals. 2 tablets before breakfast and 1 tablet before dinner   metoprolol succinate XL (TOPROL XL) 100 mg extended release tablet Take one tablet by mouth daily.   multivitamin (ONE-A-DAY) tablet Take one tablet by mouth daily.   olmesartan (BENICAR) 40 mg tablet Take one tablet by mouth daily.   omeprazole DR (PRILOSEC) 20 mg capsule Take one capsule by mouth at bedtime daily.   pregabalin (LYRICA) 100 mg capsule Take one capsule by mouth three times daily.   traMADoL (ULTRAM) 50 mg tablet Take one tablet by mouth every 8 hours as needed for Pain.   triamterene-hydrochlorothiazide (MAXZIDE) 75-50 mg tablet Take one tablet by mouth every morning.         Review of Systems/Medical History      Patient summary reviewed  Pertinent labs reviewed    PONV Screening: Non-smoker, Female sex and Postoperative opioids  No history of anesthetic complications  No family history of anesthetic complications      Airway - negative        Pulmonary      Not a current smoker        No indications/hx of pneumonia      No pulmonary embolus      No recent URI      No Obstructive Sleep Apnea      Cardiovascular       Recent diagnostic studies:          ECG      Exercise tolerance: >4 METS (DASI 7.2 METs)      Beta Blocker therapy: No      Beta blockers within 24 hours: n/a        Hypertension, well controlled      No valvular problems/murmurs        No past MI:        No PTCA      No dysrhythmias      No angina      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)      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 )      Hyperlipidemia      No orthopnea      No dyspnea on exertion      No syncope      ASA 81mg , Plavix    Denies hx of heart disease, no ECHO with stroke        GI/Hepatic/Renal       Inflammatory bowel disease (Crohn's, recurrent SBO)        GERD (PPI), well controlled      No liver disease:        No renal disease:        Neuro/Psych       No seizures      No hx TIA        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; 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; L-sided weakness, 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), < 9 Months      No substance use      Weakness (fall 12/2021- compression fracture (2 separate falls))      No psychiatric history      BPPV      Musculoskeletal         Arthritis:  osteo      Fractures (sacral fracture )      Endocrine/Other       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    Neurological Findings:       Alert and oriented x  3      Motor deficit    Constitutional findings:       No acute distress      Well-developed       Diagnostic Tests  Hematology:   Lab Results   Component Value Date    HGB 12.0 01/18/2022    HCT 35.3 01/18/2022    PLTCT 295 01/18/2022    WBC 5.0 01/18/2022    MCV 97.4 01/18/2022    MCH 33.1 01/18/2022    MCHC 34.0 01/18/2022    MPV 8.5 01/18/2022    RDW 17.1 01/18/2022         General Chemistry:   Lab Results   Component Value Date    NA 142 01/18/2022    K 3.3 01/18/2022    CL 102 01/18/2022    CO2 27 01/18/2022    GAP 13 01/18/2022    BUN 19 01/18/2022    CR 0.7 01/19/2022    CR 0.63 01/18/2022    GLU 98 01/18/2022    CA 9.2 01/18/2022      Coagulation: No results found for: PT, PTT, INR      ECHO 2021  1. Normal LV systolic function.   2. Grade 1 diastolic dysfunction.   3. Mild aortic stenosis.   4.?Mild pulmonary HTN.       STOP-BANG score: 4  (score of 5-8 high risk for OSA, 3-4 with HCO3 >28 also high risk. OSA associated with greater than twice the odds for respiratory failure, cardiac events, ICU admission, and difficult intubation)        PLAN:  Seen day prior to procedure  Labs: BMP, CBC today  Discussed DM, DOS goal < 200mg /dL  ROI: none  Consults: staff anesthesia to consult given close proximity to stroke (11/2021), GA vs MAC etc    Anesthesia Plan    ASA score: 3   Plan: MAC  Induction method: intravenous  NPO status: acceptable      Informed Consent  Anesthetic plan and risks discussed with patient.  Use of blood products discussed with patient  Blood Consent: consented      Plan discussed with: anesthesiologist and CRNA.      Pre-Anesthesia Evaluation Attestation:  I have reviewed key portions of the anesthesia pre-procedure evaluation, and have examined the patient's airway, heart, and lungs and agree with what is documented.   The anesthesia plan is  MAC (Monitored Anesthesia Care) and with General back up  and ASA Classification: ASA III    Staff name:  Lorel Monaco, MD Date:  03/10/2022

## 2022-03-10 NOTE — H&P (View-Only)
Pre Procedure History and Physical/Sedation Plan-OP    Procedure Date: 03/10/2022     Planned Procedure(s):  Sacroplasty    Indication for exam:  Treatment of sacral fracture  __________________________________________________________________    Chief Complaint:  Sacral fracture    History of Present Illness: Sharon Reese is a 71 y.o. female with a sacral fracture who presents for procedure today. Pt complains of low back pain.    There are no problems to display for this patient.    Medical History:   Diagnosis Date   ? Compression fracture of L2 vertebra (HCC)    ? Crohn disease (HCC)    ? CVA (cerebral vascular accident) [facility])    ? Diabetes (HCC)    ? GERD (gastroesophageal reflux disease)    ? HTN (hypertension)    ? Hyperlipidemia    ? Mild aortic stenosis by prior echocardiogram       Surgical History:   Procedure Laterality Date   ? HX JOINT REPLACEMENT  2013    Rt Knee   ? ABDOMEN SURGERY      Bowel Resec x2 for obstruction   ? HX BLADDER SUSPENSION     ? HX HYSTERECTOMY        No medications prior to admission.     Allergies   Allergen Reactions   ? Hydrocodone MENTAL STATUS CHANGES     Groggy, slow cognition, was in a hydrocodone fog   ? Remicade [Infliximab] HIVES and SHORTNESS OF BREATH   ? Codeine HEADACHE       Social History:   Social History     Tobacco Use   ? Smoking status: Never   ? Smokeless tobacco: Never   Vaping Use   ? Vaping Use: Never used   Substance Use Topics   ? Alcohol use: Not Currently      History reviewed. No pertinent family history.     Review of Systems  Constitutional: negative for fevers  Respiratory: negative for cough or increased work of breathing  Cardiovascular: negative for chest pain  Gastrointestinal: negative for nausea, vomiting and abdominal pain  Musculoskeletal:positive for back pain    Previous Anesthetic/Sedation History:  Reviewed      Physical Exam:  Vital Signs: Last Filed In 24 Hours Vital Signs: 24 Hour Range   BP: 128/70 (05/25 1130)  Pulse: 68 (05/25 1130)  Respirations: 13 PER MINUTE (05/25 1130)  SpO2: 93 % (05/25 1130)  SpO2 Pulse: 68 (05/25 1130) BP: (128)/(70)   Pulse:  [68]   Respirations:  [13 PER MINUTE]   SpO2:  [93 %]             General appearance: alert and no distress  Neurologic: Grossly normal, at baseline  Lungs: Nonlabored with normal effort  Abdomen: soft, non-tender.   Extremities: extremities normal, atraumatic, no cyanosis or edema      Pre-procedure anxiolysis plan: Per Anesthesia  Sedation/Medication Plan: Per Anesthesia  Personal history of sedation complications: Per Anesthesia  Family history of sedation complications: Per Anesthesia  Medications for Reversal: Per Anesthesia  Discussion/Reviews:  Physician has discussed risks and alternatives of this type of sedation and above planned procedures with patient    NPO Status: Acceptable  Airway:  Per Anesthesia  Head and Neck: Per Anesthesia  Mouth: Per Anesthesia   Anesthesia Classification:  Per Anesthesia  Pregnancy Status: N/A      Lab/Radiology/Other Diagnostic Tests:  Labs:  Pertinent labs reviewed  Samson Frederic, APRN-NP  Pager [phone]

## 2022-03-10 NOTE — Progress Notes
Pt recovery complete.     Ambulatory status: Wheelchair- Transfers without Assist .   Vitals stable. Dressing is clean, dry, and intact.  Pain level returned to baseline.  Discharge instructions reviewed with: patient and daughter  AVS signed and provided to patient.    Pt discharged to main lobby via wheelchair transport.

## 2022-03-23 ENCOUNTER — Encounter: Admit: 2022-03-23 | Discharge: 2022-03-23 | Payer: MEDICARE

## 2022-03-23 NOTE — Progress Notes
Interventional Radiology Progress Note    Follow up call placed to patient s/p sacroplasty 03/10/22. Patient reports she is doing much better and has had a significant amount of pain relief since the procedure. She reports some tenderness but overall is doing well. All questions answered and patient provided our office number for any further questions or concerns.    Polo Riley, RN

## 2022-04-28 ENCOUNTER — Encounter: Admit: 2022-04-28 | Discharge: 2022-04-28 | Payer: MEDICARE

## 2023-01-31 ENCOUNTER — Encounter: Admit: 2023-01-31 | Discharge: 2023-01-31 | Payer: MEDICARE

## 2024-04-17 ENCOUNTER — Encounter: Admit: 2024-04-17 | Discharge: 2024-04-17 | Payer: MEDICARE

## 2024-08-29 ENCOUNTER — Encounter: Admit: 2024-08-29 | Discharge: 2024-08-29 | Payer: MEDICARE
